# Patient Record
Sex: Female | Born: 2000 | Race: White | Hispanic: Yes | Marital: Single | Smoking: Never smoker
Health system: Southern US, Community
[De-identification: ages and names within clinical notes are randomized; demographics above are authoritative.]

## PROBLEM LIST (undated history)

## (undated) DIAGNOSIS — D6800 Von Willebrand disease, unspecified: Secondary | ICD-10-CM

## (undated) DIAGNOSIS — F419 Anxiety disorder, unspecified: Secondary | ICD-10-CM

## (undated) DIAGNOSIS — J45909 Unspecified asthma, uncomplicated: Secondary | ICD-10-CM

## (undated) DIAGNOSIS — F32A Depression, unspecified: Secondary | ICD-10-CM

## (undated) HISTORY — PX: BARIATRIC SURGERY: SHX1103

## (undated) HISTORY — DX: Anxiety disorder, unspecified: F41.9

## (undated) HISTORY — PX: CHOLECYSTECTOMY: SHX55

## (undated) HISTORY — DX: Von Willebrand disease, unspecified: D68.00

## (undated) HISTORY — DX: Depression, unspecified: F32.A

---
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
## 2022-01-12 ENCOUNTER — Ambulatory Visit (INDEPENDENT_AMBULATORY_CARE_PROVIDER_SITE_OTHER): Payer: Medicaid Other | Admitting: Certified Nurse Midwife

## 2022-01-12 ENCOUNTER — Ambulatory Visit: Payer: Medicaid Other | Admitting: *Deleted

## 2022-01-12 ENCOUNTER — Other Ambulatory Visit: Payer: Self-pay | Admitting: *Deleted

## 2022-01-12 ENCOUNTER — Ambulatory Visit: Payer: Medicaid Other

## 2022-01-12 ENCOUNTER — Ambulatory Visit: Payer: Medicaid Other | Attending: Obstetrics and Gynecology

## 2022-01-12 VITALS — BP 123/66 | HR 91 | Wt 205.4 lb

## 2022-01-12 VITALS — BP 112/48 | HR 91

## 2022-01-12 DIAGNOSIS — Z3A29 29 weeks gestation of pregnancy: Secondary | ICD-10-CM

## 2022-01-12 DIAGNOSIS — O99113 Other diseases of the blood and blood-forming organs and certain disorders involving the immune mechanism complicating pregnancy, third trimester: Secondary | ICD-10-CM

## 2022-01-12 DIAGNOSIS — O09892 Supervision of other high risk pregnancies, second trimester: Secondary | ICD-10-CM | POA: Diagnosis present

## 2022-01-12 DIAGNOSIS — O0990 Supervision of high risk pregnancy, unspecified, unspecified trimester: Secondary | ICD-10-CM | POA: Diagnosis present

## 2022-01-12 DIAGNOSIS — Z3689 Encounter for other specified antenatal screening: Secondary | ICD-10-CM

## 2022-01-12 DIAGNOSIS — D6800 Von Willebrand disease, unspecified: Secondary | ICD-10-CM | POA: Diagnosis present

## 2022-01-12 DIAGNOSIS — O09893 Supervision of other high risk pregnancies, third trimester: Secondary | ICD-10-CM

## 2022-01-12 DIAGNOSIS — O99891 Other specified diseases and conditions complicating pregnancy: Secondary | ICD-10-CM

## 2022-01-12 DIAGNOSIS — F419 Anxiety disorder, unspecified: Secondary | ICD-10-CM | POA: Insufficient documentation

## 2022-01-12 DIAGNOSIS — O99843 Bariatric surgery status complicating pregnancy, third trimester: Secondary | ICD-10-CM

## 2022-01-12 DIAGNOSIS — Z9884 Bariatric surgery status: Secondary | ICD-10-CM | POA: Diagnosis present

## 2022-01-12 DIAGNOSIS — O0993 Supervision of high risk pregnancy, unspecified, third trimester: Secondary | ICD-10-CM

## 2022-01-12 NOTE — Progress Notes (Signed)
   PRENATAL VISIT NOTE  Subjective:  Brenda Ramirez is a 21 y.o. G1P0 at [redacted]w[redacted]d being seen today for ongoing prenatal care.  She is currently monitored for the following issues for this high-risk pregnancy and has Von Willebrand disease (HCC); Asthma; Supervision of high risk pregnancy, antepartum; Anxiety; Depression; and History of sleeve gastrectomy on their problem list.  Patient reports no complaints.  Contractions: Not present. Vag. Bleeding: None.  Movement: Present. Denies leaking of fluid.   The following portions of the patient's history were reviewed and updated as appropriate: allergies, current medications, past family history, past medical history, past social history, past surgical history and problem list.   Objective:   Vitals:   01/12/22 0902  BP: 123/66  Pulse: 91  Weight: 205 lb 6.4 oz (93.2 kg)   Fetal Status: Fetal Heart Rate (bpm): 152 Fundal Height: 30 cm Movement: Present     General:  Alert, oriented and cooperative. Patient is in no acute distress.  Skin: Skin is warm and dry. No rash noted.   Cardiovascular: Normal heart rate noted  Respiratory: Normal respiratory effort, no problems with respiration noted  Abdomen: Soft, gravid, appropriate for gestational age.  Pain/Pressure: Absent     Pelvic: Cervical exam deferred        Extremities: Normal range of motion.  Edema: None  Mental Status: Normal mood and affect. Normal behavior. Normal judgment and thought content.   Assessment and Plan:  Pregnancy: G1P0 at [redacted]w[redacted]d 1. Supervision of high risk pregnancy in third trimester - Doing well, feeling regular and vigorous fetal movement   2. [redacted] weeks gestation of pregnancy - Routine OB care including review of 3rd trimester labs   Centering Pregnancy, Session#6: Reviewed resources in [employer].  Facilitated discussion today:  labor coping strategies immediate postpartum planning mindfulness activity with positive affirmations Patient to  continue group care.   Preterm labor symptoms and general obstetric precautions including but not limited to vaginal bleeding, contractions, leaking of fluid and fetal movement were reviewed in detail with the patient. Please refer to After Visit Summary for other counseling recommendations.   Return in about 2 weeks (around 01/26/2022) for IN-PERSON, CENTERING.  Future Appointments  Date Time Provider Department Center  01/26/2022  9:00 AM CENTERING PROVIDER [facility]  02/09/2022  9:00 AM CENTERING PROVIDER [facility]  02/11/2022 10:30 AM [facility] NURSE [facility]  02/11/2022 10:45 AM [facility] US6 [facility]  02/23/2022  9:00 AM CENTERING PROVIDER [facility]  03/09/2022  9:00 AM CENTERING PROVIDER [facility]    Bernerd Limbo, CNM

## 2022-01-18 ENCOUNTER — Emergency Department (HOSPITAL_BASED_OUTPATIENT_CLINIC_OR_DEPARTMENT_OTHER)
Admission: EM | Admit: 2022-01-18 | Discharge: 2022-01-19 | Disposition: A | Discharge status: Home / Self Care | Payer: Medicaid Other | Attending: Emergency Medicine | Admitting: Emergency Medicine

## 2022-01-18 ENCOUNTER — Encounter (HOSPITAL_BASED_OUTPATIENT_CLINIC_OR_DEPARTMENT_OTHER): Payer: Self-pay | Admitting: Urology

## 2022-01-18 ENCOUNTER — Encounter: Payer: Medicaid Other | Admitting: Nurse Practitioner

## 2022-01-18 DIAGNOSIS — R519 Headache, unspecified: Secondary | ICD-10-CM | POA: Diagnosis not present

## 2022-01-18 DIAGNOSIS — R197 Diarrhea, unspecified: Secondary | ICD-10-CM | POA: Insufficient documentation

## 2022-01-18 DIAGNOSIS — B349 Viral infection, unspecified: Secondary | ICD-10-CM | POA: Diagnosis present

## 2022-01-18 DIAGNOSIS — J101 Influenza due to other identified influenza virus with other respiratory manifestations: Secondary | ICD-10-CM | POA: Insufficient documentation

## 2022-01-18 DIAGNOSIS — Z20822 Contact with and (suspected) exposure to covid-19: Secondary | ICD-10-CM | POA: Insufficient documentation

## 2022-01-18 DIAGNOSIS — R3121 Asymptomatic microscopic hematuria: Secondary | ICD-10-CM

## 2022-01-18 DIAGNOSIS — R112 Nausea with vomiting, unspecified: Secondary | ICD-10-CM | POA: Insufficient documentation

## 2022-01-18 DIAGNOSIS — M791 Myalgia, unspecified site: Secondary | ICD-10-CM | POA: Diagnosis not present

## 2022-01-18 DIAGNOSIS — R5383 Other fatigue: Secondary | ICD-10-CM | POA: Diagnosis not present

## 2022-01-18 DIAGNOSIS — E876 Hypokalemia: Secondary | ICD-10-CM | POA: Insufficient documentation

## 2022-01-18 LAB — CBC WITH DIFFERENTIAL/PLATELET
Abs Immature Granulocytes: 0.05 10*3/uL (ref 0.00–0.07)
Basophils Absolute: 0 10*3/uL (ref 0.0–0.1)
Basophils Relative: 0 %
Eosinophils Absolute: 0.2 10*3/uL (ref 0.0–0.5)
Eosinophils Relative: 2 %
HCT: 28.3 % — ABNORMAL LOW (ref 36.0–46.0)
Hemoglobin: 9.3 g/dL — ABNORMAL LOW (ref 12.0–15.0)
Immature Granulocytes: 1 %
Lymphocytes Relative: 32 %
Lymphs Abs: 3.1 10*3/uL (ref 0.7–4.0)
MCH: 30.9 pg (ref 26.0–34.0)
MCHC: 32.9 g/dL (ref 30.0–36.0)
MCV: 94 fL (ref 80.0–100.0)
Monocytes Absolute: 0.7 10*3/uL (ref 0.1–1.0)
Monocytes Relative: 8 %
Neutro Abs: 5.4 10*3/uL (ref 1.7–7.7)
Neutrophils Relative %: 57 %
Platelets: 243 10*3/uL (ref 150–400)
RBC: 3.01 MIL/uL — ABNORMAL LOW (ref 3.87–5.11)
RDW: 12.6 % (ref 11.5–15.5)
WBC: 9.4 10*3/uL (ref 4.0–10.5)
nRBC: 0 % (ref 0.0–0.2)

## 2022-01-18 LAB — SARS CORONAVIRUS 2 BY RT PCR: SARS Coronavirus 2 by RT PCR: NEGATIVE

## 2022-01-18 LAB — COMPREHENSIVE METABOLIC PANEL
ALT: 10 U/L (ref 0–44)
AST: 16 U/L (ref 15–41)
Albumin: 2.8 g/dL — ABNORMAL LOW (ref 3.5–5.0)
Alkaline Phosphatase: 71 U/L (ref 38–126)
Anion gap: 5 (ref 5–15)
BUN: 6 mg/dL (ref 6–20)
CO2: 22 mmol/L (ref 22–32)
Calcium: 8.1 mg/dL — ABNORMAL LOW (ref 8.9–10.3)
Chloride: 109 mmol/L (ref 98–111)
Creatinine, Ser: 0.53 mg/dL (ref 0.44–1.00)
GFR, Estimated: >60 mL/min (ref >60)
Glucose, Bld: 84 mg/dL (ref 70–99)
Potassium: 3.4 mmol/L — ABNORMAL LOW (ref 3.5–5.1)
Sodium: 136 mmol/L (ref 135–145)
Total Bilirubin: 0.5 mg/dL (ref 0.3–1.2)
Total Protein: 6.6 g/dL (ref 6.5–8.1)

## 2022-01-18 LAB — URINALYSIS, ROUTINE W REFLEX MICROSCOPIC
Bilirubin Urine: NEGATIVE
Glucose, UA: NEGATIVE mg/dL
Ketones, ur: NEGATIVE mg/dL
Nitrite: NEGATIVE
Protein, ur: 30 mg/dL — AB
Specific Gravity, Urine: >=1.03 (ref 1.005–1.030)
pH: 6 (ref 5.0–8.0)

## 2022-01-18 LAB — URINALYSIS, MICROSCOPIC (REFLEX): RBC / HPF: >50 RBC/hpf (ref 0–5)

## 2022-01-18 LAB — LIPASE, BLOOD: Lipase: 27 U/L (ref 11–51)

## 2022-01-18 MED ORDER — ACETAMINOPHEN 325 MG PO TABS
ORAL_TABLET | ORAL | Status: AC
  Filled 2022-01-18: qty 2

## 2022-01-18 MED ORDER — ONDANSETRON 4 MG PO TBDP
4.0000 mg | ORAL_TABLET | Freq: Once | ORAL | Status: AC
  Administered 2022-01-18: 4 mg via ORAL

## 2022-01-18 MED ORDER — SODIUM CHLORIDE 0.9 % IV BOLUS
1000.0000 mL | Freq: Once | INTRAVENOUS | Status: AC
  Administered 2022-01-18: 1000 mL via INTRAVENOUS

## 2022-01-18 MED ORDER — ACETAMINOPHEN 325 MG PO TABS
650.0000 mg | ORAL_TABLET | Freq: Once | ORAL | Status: AC
Start: 2022-01-18 — End: 2022-01-18
  Administered 2022-01-18: 650 mg via ORAL

## 2022-01-18 MED ORDER — ONDANSETRON 4 MG PO TBDP
ORAL_TABLET | ORAL | Status: AC
  Filled 2022-01-18: qty 1

## 2022-01-18 NOTE — Progress Notes (Signed)
Because you are pregnant, I feel your condition warrants further evaluation and I recommend that you be seen in a face to face visit. It is best you are evaluated for dehydration and to assure you and your baby are receiving proper nutrients while you recover.    NOTE: There will be NO CHARGE for this eVisit   If you are having a true medical emergency please call 911.      For an urgent face to face visit, [place] has seven urgent care centers for your convenience:     [facility] Health Urgent Care Center at [place] Directions [phone] [address] [place], [place] [address]    [facility] Health Urgent Care Center [facility]) Get Driving Directions [phone] [address] [place], [place] [address]  [facility] Health Urgent Care Center [facility]) Get Driving Directions [phone] [address] [place],  [place]  [address]  [facility] Urgent Care Center [facility] [employer] Directions  [phone] [address] [place],  [place] [address]   [facility] Health Urgent Care at [facility] Get Driving Directions [phone] [address] [place], [place] [address]   [facility] Health Urgent Care at [facility] Get Driving Directions  [phone] [address] [place], [place] [address]   [facility] Health Urgent Care at [facility] Directions [phone] [address] [place], [place] [address]  Your MyChart E-visit questionnaire answers were reviewed by a board certified advanced clinical practitioner to complete your personal care plan based on your specific symptoms.  Thank you for using e-Visits.

## 2022-01-18 NOTE — Discharge Instructions (Signed)
You were seen in the emergency department for evaluation of nausea vomiting diarrhea body aches headache.  Your COVID test was negative.  You had lab work done that did not show an obvious explanation for your symptoms.  Please follow-up with your OB as scheduled.  Tylenol for pain.  Keep well-hydrated.

## 2022-01-18 NOTE — Progress Notes (Signed)
Dr Para March notified of pt and complaints.  MD reviews lab results and fhr tracing.  MD says pt may be OB cleared at this time.

## 2022-01-18 NOTE — Progress Notes (Signed)
[facility] notified that pt is OB cleared and is being removed from monitors at this time.

## 2022-01-18 NOTE — ED Triage Notes (Signed)
Nausea/vomiting/diarrhea, body aches, headache, sweats and chills x 3 days  States Abd pain, denies any vaginal discharge Feeling baby movement normally   Pt states [redacted] week Gestation (EDD 03/29/2022)  OB/GYN: [address], [place] [address]. Shawnie Pons G-1, P-0. A-0

## 2022-01-18 NOTE — ED Provider Notes (Signed)
[facility] Provider Note   CSN: [id number] Arrival date & time: 01/18/22  2104     History {Add pertinent medical, surgical, social history, OB history to HPI:1} Chief Complaint  Patient presents with   Flu like Symptoms    Brenda Ramirez is a 21 y.o. female.  He is a G1, P0 about [redacted] weeks pregnant here with headache body aches nausea vomiting diarrhea that started 3 days ago.  She is felt hot and cold but has not checked her temperature.  She does endorse abdominal pain but there is also just pain over her entire body.  No blood in the vomit or diarrhea.  No urinary symptoms.  No vaginal bleeding or discharge.  She has felt the baby move.  She talked to her OB who recommended she come to any [facility] emergency department for evaluation.  The history is provided by the patient.  Influenza Presenting symptoms: diarrhea, fatigue, headache, myalgias, nausea and vomiting   Presenting symptoms: no cough, no rhinorrhea, no shortness of breath and no sore throat   Myalgias:    Location:  Generalized   Quality:  Aching   Onset quality:  Gradual   Duration:  4 days   Timing:  Constant   Progression:  Unchanged      Home Medications Prior to Admission medications   Medication Sig Start Date End Date Taking? Authorizing Provider  albuterol (VENTOLIN HFA) 108 (90 Base) MCG/ACT inhaler Inhale 2 puffs into the lungs every 6 (six) hours as needed for wheezing or shortness of breath. Patient not taking: Reported on 09/22/2021 08/30/21   Donnamae Jude, MD  busPIRone (BUSPAR) 7.5 MG tablet Take 1 tablet (7.5 mg total) by mouth 3 (three) times daily. Patient not taking: Reported on 09/22/2021 08/30/21   Donnamae Jude, MD  cyclobenzaprine (FLEXERIL) 5 MG tablet Take 1 tablet (5 mg total) by mouth 3 (three) times daily as needed for muscle spasms. Patient not taking: Reported on 12/15/2021 12/13/21   Gabriel Carina, CNM  ferrous sulfate (FERROUSUL) 325 (65  FE) MG tablet Take 1 tablet (325 mg total) by mouth 2 (two) times daily. Patient not taking: Reported on 11/03/2021 08/31/21   Donnamae Jude, MD  Prenatal Vit-Fe Fumarate-FA (PRENATAL VITAMIN) 27-0.8 MG TABS Take 1 tablet by mouth daily. 08/30/21   Donnamae Jude, MD  vitamin B-12 (CYANOCOBALAMIN) 100 MCG tablet Take 1 tablet (100 mcg total) by mouth daily. Patient not taking: Reported on 09/22/2021 08/31/21   Donnamae Jude, MD  Vitamin D, Ergocalciferol, (DRISDOL) 1.25 MG (50000 UNIT) CAPS capsule Take 1 capsule (50,000 Units total) by mouth every 7 (seven) days. Patient not taking: Reported on 12/15/2021 11/15/21   Renee Harder, CNM      Allergies    Pineapple    Review of Systems   Review of Systems  Constitutional:  Positive for fatigue.  HENT:  Negative for rhinorrhea and sore throat.   Respiratory:  Negative for cough and shortness of breath.   Cardiovascular:  Negative for chest pain and leg swelling.  Gastrointestinal:  Positive for abdominal pain, diarrhea, nausea and vomiting.  Genitourinary:  Negative for dysuria, vaginal bleeding and vaginal discharge.  Musculoskeletal:  Positive for myalgias.  Skin:  Negative for rash.  Neurological:  Positive for headaches.    Physical Exam Updated Vital Signs BP 120/66 (BP Location: Left Arm)   Pulse 83   Temp 98.3 F (36.8 C) (Oral)   Resp 20  Ht 5\' 6"  (1.676 m)   Wt 92.8 kg   LMP 06/14/2021   SpO2 100%   BMI 33.01 kg/m  Physical Exam Vitals and nursing note reviewed.  Constitutional:      General: She is not in acute distress.    Appearance: Normal appearance. She is well-developed.  HENT:     Head: Normocephalic and atraumatic.  Eyes:     Conjunctiva/sclera: Conjunctivae normal.  Cardiovascular:     Rate and Rhythm: Normal rate and regular rhythm.     Heart sounds: No murmur heard. Pulmonary:     Effort: Pulmonary effort is normal. No respiratory distress.     Breath sounds: Normal breath sounds.  Abdominal:      Palpations: Abdomen is soft.     Tenderness: There is no abdominal tenderness. There is no guarding or rebound.  Musculoskeletal:        General: Normal range of motion.     Cervical back: Neck supple.     Right lower leg: No edema.     Left lower leg: No edema.  Skin:    General: Skin is warm and dry.     Capillary Refill: Capillary refill takes less than 2 seconds.  Neurological:     General: No focal deficit present.     Mental Status: She is alert.     Sensory: No sensory deficit.     Motor: No weakness.     ED Results / Procedures / Treatments   Labs (all labs ordered are listed, but only abnormal results are displayed) Labs Reviewed  SARS CORONAVIRUS 2 BY RT PCR  COMPREHENSIVE METABOLIC PANEL  LIPASE, BLOOD  CBC WITH DIFFERENTIAL/PLATELET  URINALYSIS, ROUTINE W REFLEX MICROSCOPIC    EKG None  Radiology No results found.  Procedures Procedures  {Document cardiac monitor, telemetry assessment procedure when appropriate:1}  Medications Ordered in ED Medications  sodium chloride 0.9 % bolus 1,000 mL (has no administration in time range)    ED Course/ Medical Decision Making/ A&P                           Medical Decision Making Amount and/or Complexity of Data Reviewed Labs: ordered.  [name] was evaluated in Emergency Department on 01/18/2022 for the symptoms described in the history of present illness. She was evaluated in the context of the global COVID-19 pandemic, which necessitated consideration that the patient might be at risk for infection with the SARS-CoV-2 virus that causes COVID-19. Institutional protocols and algorithms that pertain to the evaluation of patients at risk for COVID-19 are in a state of rapid change based on information released by regulatory bodies including the CDC and federal and state organizations. These policies and algorithms were followed during the patient's care in the ED.  This patient complains of  ***; this involves an extensive number of treatment Options and is a complaint that carries with it a high risk of complications and morbidity. The differential includes ***  I ordered, reviewed and interpreted labs, which included *** I ordered medication *** and reviewed PMP when indicated. I ordered imaging studies which included *** and I independently    visualized and interpreted imaging which showed *** Additional history obtained from *** Previous records obtained and reviewed *** I consulted *** and discussed lab and imaging findings and discussed disposition.  Cardiac monitoring reviewed, *** Social determinants considered, *** Critical Interventions: ***  After the interventions stated above, I reevaluated the  patient and found *** Admission and further testing considered, ***    {Document critical care time when appropriate:1} {Document review of labs and clinical decision tools ie heart score, Chads2Vasc2 etc:1}  {Document your independent review of radiology images, and any outside records:1} {Document your discussion with family members, caretakers, and with consultants:1} {Document social determinants of health affecting pt's care:1} {Document your decision making why or why not admission, treatments were needed:1} Final Clinical Impression(s) / ED Diagnoses Final diagnoses:  None    Rx / DC Orders ED Discharge Orders     None

## 2022-01-18 NOTE — Progress Notes (Signed)
RROB called to remotely monitor pt who is G1P0 at [redacted]wk pregnant who receives prenatal care with [facility].  She presents to [facility] complaining of flu like symptoms and abdominal pain that is constant.  She also reports N/V/D and says this has been going on for 3days.  Pt denies LOF or vag bleeding and reports normal fetal movement.  Pt was placed on fhr monitors by [facility] but the wrong MRN was assigned to the OBIX bed. That portion of the fhr tracing is under MRN [id number].  The RROB nurse fixed that issue and now obix is tracing on the correct patient.

## 2022-02-09 NOTE — Progress Notes (Unsigned)
Called Pt to find out why she has missed her last 2 Centering Session, she states it was due to transportation, she has been reaching out to [facility] but no one is picking up. Reminded Pt of [place] that's scheduled for 02/11/22 & that she only has 2 more Centering Sessions. Reiterated that Centering is her Prenatal care also. Pt verbalized understanding. Per Edd Arbour, CNM, Pt needs to be seen, will put her on OB schedule.

## 2022-02-10 ENCOUNTER — Encounter: Payer: Self-pay | Admitting: Certified Nurse Midwife

## 2022-02-11 ENCOUNTER — Ambulatory Visit: Payer: Medicaid Other

## 2022-02-11 ENCOUNTER — Ambulatory Visit: Payer: Medicaid Other | Attending: Maternal & Fetal Medicine

## 2022-02-14 ENCOUNTER — Encounter: Payer: Medicaid Other | Admitting: Advanced Practice Midwife

## 2022-02-16 NOTE — Progress Notes (Unsigned)
   PRENATAL VISIT NOTE  Subjective:  Brenda Ramirez is a 21 y.o. G1P0 at [redacted]w[redacted]d being seen today for ongoing prenatal care.  She is currently monitored for the following issues for this high-risk pregnancy and has Von Willebrand disease (HCC); Asthma; Supervision of high risk pregnancy, antepartum; Anxiety; Depression; and History of sleeve gastrectomy on their problem list.  Patient reports {sx:14538}.  Contractions: Not present. Vag. Bleeding: None.  Movement: Present. Denies leaking of fluid.   The following portions of the patient's history were reviewed and updated as appropriate: allergies, current medications, past family history, past medical history, past social history, past surgical history and problem list.   Objective:   Vitals:   02/17/22 0915  BP: 112/66  Pulse: 83  Weight: 210 lb 14.4 oz (95.7 kg)    Fetal Status: Fetal Heart Rate (bpm): 145   Movement: Present     General:  Alert, oriented and cooperative. Patient is in no acute distress.  Skin: Skin is warm and dry. No rash noted.   Cardiovascular: Normal heart rate noted  Respiratory: Normal respiratory effort, no problems with respiration noted  Abdomen: Soft, gravid, appropriate for gestational age.  Pain/Pressure: Absent     Pelvic: {Blank single:19197::"Cervical exam performed in the presence of a chaperone","Cervical exam deferred"}        Extremities: Normal range of motion.  Edema: None  Mental Status: Normal mood and affect. Normal behavior. Normal judgment and thought content.   Assessment and Plan:  Pregnancy: G1P0 at [redacted]w[redacted]d 1. History of sleeve gastrectomy - Growth US's and antenatal testing per MFM  2. Other depression - ***IBH?  3. Von Willebrand disease (HCC) ***  4. Supervision of high risk pregnancy, antepartum - F/U Centering 9/20. Emphasized importance of attending [facility] and growth US's.   Preterm labor symptoms and general obstetric precautions including but not limited to  vaginal bleeding, contractions, leaking of fluid and fetal movement were reviewed in detail with the patient. Please refer to After Visit Summary for other counseling recommendations.   No follow-ups on file.  Future Appointments  Date Time Provider Department Center  02/17/2022 10:30 AM [facility] NURSE [facility]  02/17/2022 10:45 AM [facility] US4 [facility]  02/23/2022  9:00 AM CENTERING PROVIDER [facility]  03/09/2022  9:00 AM CENTERING PROVIDER [facility]    Dorathy Kinsman, CNM

## 2022-02-16 NOTE — Patient Instructions (Addendum)
AREA PEDIATRIC/FAMILY PRACTICE PHYSICIANS  Central/[place]outheast [place] ([address]) [facility] Chambliss, MD; Eniola, MD; Hale, MD; Hensel, MD; McDiarmid, MD; McIntyer, MD; Neal, MD; Walden, MD [address] [place], [place] [address] [phone] Mon-Fri 8:30-12:30, 1:30-5:00 Providers come to see babies at [facility] Accepting Medicaid [facility] providers who accept newborns: Koirala, MD; Morrow, MD; Wolters, MD [address], [place], [place] [address] [phone] Mon-Fri 8:00-5:30 Babies seen by providers at [facility] Does NOT accept Medicaid Please call early in hospitalization for appointment (limited availability)  [facility] Mulberry, MD [address] [place], [place] [address] [phone] Mon, Tue, Thur, Fri 8:30-5:00, Wed 10:00-7:00 (closed 1-2pm) Babies seen by [facility] providers Accepting Medicaid Rubin - Pediatrician Rubin, MD [address], [place], [place] [address] [phone] Mon-Fri 8:30-5:00, Sat 8:30-12:00 Provider comes to see babies at [facility] Accepting Medicaid Must have been referred from current patients or contacted office prior to delivery [facility] ([facility]) Brown, MD; Chandler, MD; Ettefagh, MD; Grant, MD; Lester, MD; McCormick, MD; McQueen, MD; Prose, MD; Simha, MD; Stanley, MD; Stryffeler, NP; Tebben, NP [address], [place], [place] [address] [phone] Mon, Tue, Thur, Fri 8:30-5:30, Wed 9:30-5:30, Sat 8:30-12:30 Babies seen by [facility] providers Accepting Medicaid Only accepting infants of first-time parents or siblings of current patients Hospital discharge coordinator will make follow-up appointment Jack Amos [address], [place], [place]  [address] [phone]   Fax - [phone] [facility] [address], [place], [place]  [address] Phone - [phone]   Fax - [phone] Shilpa Gosrani [address], [place], [place]  [address] [phone]  [place]heast [place] ([address]) [facility] Bates, MD; Brassfield, MD; Cooper, Cox, MD; MD; Davis, MD; Dovico, MD; Ettefaugh, MD; Little, MD; Lowe, MD; Keiffer, MD; Melvin, MD; Sumner, MD; Williams, MD [address], [place], [place] [address] [phone] Mon-Fri 8:30-5:00 (extended evenings Mon-Thur as needed), Sat-Sun 10:00-1:00 Providers come to see babies at [facility] Accepting Medicaid for families of first-time babies and families with all children in the household age 3 and under. Must register with office prior to making appointment (M-F only). [facility] Henson, NP; Knapp, MD; Lalonde, MD; Tysinger, PA [address] [place], [place] [address] [phone] Mon-Fri 8:00-5:00 Babies seen by providers at [facility] Does NOT accept Medicaid/Commercial Insurance Only [facility] ([facility])  Artis, MD; Barnes, MD; Bratton, MD; Coccaro, MD; [place] Gardner, MD; Kramer, MD; Marshall, MD; Netherton, MD; Poleto, MD; Skinner, MD [address] [place], [place] [address] [phone] Mon-Fri 8:30-5:30, Sat (Oct.-Mar.) 9:00-1:00 Babies seen by providers at [facility] Accepting Medicaid  [place] ([address]) [facility] Reid, MD; Warner, MD [address], [place],  [address] [phone] Mon-Fri 8:30-5:00, Sat 8:30-12:00 Providers come to see babies at [facility] Does NOT accept Medicaid [facility] Becker, PA; Hagler, MD; Scifres, PA; Sun, MD; Swayne, MD [address], ,  [address] [phone] Mon-Fri 8:00-5:00 Babies seen by providers at [facility] Does NOT accept Medicaid Only accepting babies of parents who  are patients Please call early in hospitalization for appointment (limited availability)  Pediatricians Clark, MD; Frye, MD; Kelleher, MD; Mack, NP; Miller, MD; O'Keller, MD; Patterson, NP; Pudlo, MD; Puzio, MD; Thomas, MD; Tucker, MD; Twiselton, MD [address], [place], [place] [address] [phone] Mon-Fri 8:00-5:00, Sat 9:00-12:00 Providers come to see babies at [facility] Does NOT accept Medicaid  [place] ([address]) [facility] providers accepting new patients: Brake, NP; Wharton, PA [address], [place], [place] [address] [phone] Mon-Fri 8:00-5:00 Babies seen by providers at [facility] Does NOT accept Medicaid Only accepting babies of parents who are patients Please call early in hospitalization for appointment (limited availability) [facility] Gay, MD; Quinlan, MD [address] [place], [place] [address] [phone] (press 1 to schedule appointment) Mon-Fri 8:00-5:00 Providers come to see babies at [facility] Does NOT accept Medicaid [facility] Mazer, MD [address] [place], [place] [address] [phone] Mon-Fri 8:30-5:00 (lunch 12:30-1:00), extended hours by appointment only Wed 5:00-6:30 Babies seen by [facility] providers Accepting Medicaid [facility] Banks, MD; Jordan, MD; Koberlein, MD [address], [place], [place] [address] [phone] Mon-Fri 8:00-5:00 Babies seen by [facility] providers Does NOT accept Medicaid [facility] Parker, MD; Hunter, MD; Wallace, DO [address] [place], [place] [address] [phone] Mon-Fri 8:00-5:00 Babies seen by [facility] providers Does NOT accept Medicaid [facility] Brandon, PA; Brecken, PA; Christy, NP; Dees, MD; DeClaire, MD; DeWeese, MD; Hansen, NP; Mills, NP; Parrish, NP; Smoot, NP; Summer, MD; Vapne,  MD [address] [place], [place] [address] [phone] Mon-Fri 8:30-5:00, Sat 10:00-1:00 Providers come to see babies at [facility] Does NOT accept Medicaid Free prenatal information session Tuesdays at 4:45pm [facility] Bouska, MD; Gordon, PA; Jeffery, PA; Weber, PA [address] [place] [address] [phone] Mon-Fri 7:30-5:30 Babies seen by [facility] providers [facility] [address], [place], [place]  [address] [phone]   Fax - [phone]  [place] ([address] & [address]) [facility] [place] [address] [place], [place] [address] [phone] Mon-Thur 8:00-6:00 Providers come to see babies at [facility] Accepting Medicaid [facility] Anderson, NP; Badger, MD; Beal, PA; Spencer, PA [address] [place], [place] [address] [phone] Mon-Thur 7:30-7:30, Fri 7:30-4:30 Babies seen by [facility] providers Accepting Medicaid [facility] Agbuya, MD; Klett, NP; Romgoolam, MD [address], [place], [place] [address] [phone] Mon-Fri 8:30-5:00, Sat 8:30-12:00 Providers come to see babies at [facility] Accepting Medicaid Must have "Meet & Greet" appointment at office prior to delivery [facility] McCord, MD; Wallace, MD; Wood, MD [address], [place], [place] [address] [phone] Mon-Wed 8:00-6:00, Thur-Fri 8:00-5:00, Sat 9:00-12:00 Providers come to see babies at [facility] Does NOT accept Medicaid Only accepting siblings of current patients [facility]  [address], [place], [place]  [address] [phone]   Fax - [phone] [facility] [address], [place], [place]  [address] [phone]   Fax -  [phone]  [place] ([address] & [address]) [facility] Cirigliano, DO; Matthews, DO [address] [place], [place] [address] [phone] Mon-Fri 7:00-5:00 Babies seen by [facility] providers Does NOT accept Medicaid [facility] Briscoe, MD; Howley, PA; Moreira, PA [address], [place], [place] [address] [phone] Mon-Fri 8:00-5:00 Babies seen by [facility] providers Accepting Medicaid [facility] Boyd, MD; Church, PA; Jones, NP; Osborn, PA [address], ,  [address] [phone] Mon-Fri 8:00-5:00 Babies seen by providers at [facility] Accepting Medicaid  [place] ([address]) [facility] [place] Wendling, DO [address] [place], [place] [address] [phone] Mon-Fri 8:00-5:00 Babies seen by [facility] providers Does NOT accept Medicaid Limited availability, please call early in hospitalization to schedule follow-up [facility] Calderon, PA; Cummings, MD; Dillard, MD; Martin, PA; Olson, MD; VanDeven, PA [address], [place], [place] [address] [phone] Mon-Fri 8:30-5:00, Sat 9:00-12:00 Babies seen by providers at [facility] Accepting Medicaid Please register online then schedule online or call office [email or web address] Hunter, NP; Kumar, MD; Martin Rogers, PA [address], [place], [place] [address] [phone] Mon-Fri 8:00-5:00 Babies seen by providers at [facility] Accepting Medicaid [facility] ([facility]) Boswell, MD; Kristi Fleenor, NP; West, MD [address], [place], [place] [address] [phone] Mon-Fri 8:00-5:30, Sat&Sun by appointment (phones open at  8:30) Babies seen by [facility] providers Accepting Medicaid Must be a first-time baby or sibling of current patient [facility] [place]  [address], [place], [place]  [address] [phone]   Fax - [phone]  [place] ([address]) [facility] Brown, PA; Cowen, PA; Rice, MD; Helton, PA; Spry, MD [address] [place], [place] [address] [phone] Mon-Thur 8:00-7:00, Fri 8:00-5:00, Sat 8:00-12:00, Sun 9:00-12:00 Babies seen by [facility] providers Accepting Medicaid [facility] Coe-Goins, MD; Marshall, MD; Pierre-Louis, MD [address], [place], [place] [address] [phone] Mon-Thur 8:00-5:00 Babies seen by providers at [facility] Accepting Medicaid [facility] Bratton, MD; Coe-Goins, MD; Hayes, MD; Lewis, MD; List, MD; Lott, MD; Marshall, MD; Moran, MD; O'Neal, MD; Pierre-Louis, MD; Pitonzo, MD; Scholer, MD; Spangle, MD [address] [place], [place] [address] [phone] Mon-Fri 8:00-5:30, Sat (Oct.-Mar.) 9:00-1:00 Babies seen by providers at [facility] Accepting Medicaid Must fill out new patient packet, available online at [email or web address] [facility] ([facility]) Friddle, NP; Harris, NP; Kelly, NP; Logan, MD; Melvin, PA; Poth, MD; Ramadoss, MD; Stanton, NP [address], [place], [place] [address] [phone] Mon-Thur 8:00-5:30, Fri 8:00-5:00 Babies seen by providers at [facility] Accepting Medicaid  [place] ([address]) [facility] Dixon, PA; [place], MD; Pickard, MD; Tapia, PA [address], [place], [place] [address] [phone] Mon-Fri 8:00-5:00 Babies seen by providers at [facility] Accepting Medicaid   [place] ([address]) [facility] [place] Masneri, DO; Meyers, MD; Nelson, PA [address], [place], [place] [address] [phone] Mon-Fri 8:00-5:00 Babies seen by providers at [facility] Does NOT accept Medicaid Limited appointment availability, please call early in hospitalization  [facility] [place] Kunedd, DO; McGowen, MD [address], [place], [place] [address] [phone] Mon-Fri 8:00-5:00 Babies seen by [facility] providers Does NOT accept Medicaid [facility] [place] Cameron, MD; MacDonald, MD; Michaels, PA; Nayak, MD [address] [place] [address], [place], [place] [address] [phone] Mon-Fri 8:00-5:00 After hours clinic ([address] [place],  [address]) [phone] Mon-Fri 5:00-8:00, Sat 12:00-6:00, Sun 10:00-4:00 Babies seen by [facility] providers Accepting Medicaid [facility] [place] [address], [place], [place]  [address] [phone]   Fax - [phone]  [place] ([address]) [facility] [place] [facility] Andy, MD [address], [place], [place] [address] [phone] Mon-Fri 8:00-5:00 Babies seen by [facility] providers Does NOT accept Medicaid [facility] [place] ([facility] [place]) Eksir, MD [address], [place], [place] [address] [phone] Mon-Thur 8:00-7:00, Fri 8:00-5:00, Sat 8:00-12:00 Babies seen by providers at [facility] Accepting Medicaid - but does not have vaccinations in office (must be received elsewhere) Limited availability, please call early in hospitalization  [place] ([address]) [facility]  Charlene Flemming, MD [address], [place] [address] [phone]  Fax [phone]  [facility]  Kimberly Newton, MD, Annamarie Streilein, PA, Carla Hampton, PA [address] [place], [place]  [address] [phone] [facility]  [address], [place], [place] [address] [phone] [address], [place], [place] [address] [phone] (West Office)  [facility] [address], [place], [place] [address] [phone] [facility] [address] [place], [place], [place] [address] [phone] [facility] [address], [place], [place] [address] [phone] [facility] [address], [place], [place] [address] [phone] [facility] [address], [place], [place] [address] [phone] [facility] [address], [place], [place] [address] [phone] [facility]  [address], [place], [place] [address] [phone] Dr. [facility] Little [address] [place] [address], , [place] [address] [phone] [facility] [address], [place], [place] [address] [phone] [facility] [address], ,  [address] [phone]  

## 2022-02-17 ENCOUNTER — Encounter: Payer: Self-pay | Admitting: *Deleted

## 2022-02-17 ENCOUNTER — Other Ambulatory Visit: Payer: Self-pay

## 2022-02-17 ENCOUNTER — Other Ambulatory Visit: Payer: Self-pay | Admitting: *Deleted

## 2022-02-17 ENCOUNTER — Ambulatory Visit: Payer: Medicaid Other | Attending: Maternal & Fetal Medicine | Admitting: *Deleted

## 2022-02-17 ENCOUNTER — Ambulatory Visit (HOSPITAL_BASED_OUTPATIENT_CLINIC_OR_DEPARTMENT_OTHER): Payer: Medicaid Other

## 2022-02-17 ENCOUNTER — Other Ambulatory Visit: Payer: Self-pay | Admitting: Advanced Practice Midwife

## 2022-02-17 ENCOUNTER — Ambulatory Visit (INDEPENDENT_AMBULATORY_CARE_PROVIDER_SITE_OTHER): Payer: Medicaid Other | Admitting: Advanced Practice Midwife

## 2022-02-17 VITALS — BP 112/66 | HR 83 | Wt 210.9 lb

## 2022-02-17 VITALS — BP 109/50 | HR 75

## 2022-02-17 DIAGNOSIS — Z362 Encounter for other antenatal screening follow-up: Secondary | ICD-10-CM | POA: Insufficient documentation

## 2022-02-17 DIAGNOSIS — O99843 Bariatric surgery status complicating pregnancy, third trimester: Secondary | ICD-10-CM

## 2022-02-17 DIAGNOSIS — Z3689 Encounter for other specified antenatal screening: Secondary | ICD-10-CM

## 2022-02-17 DIAGNOSIS — O0990 Supervision of high risk pregnancy, unspecified, unspecified trimester: Secondary | ICD-10-CM

## 2022-02-17 DIAGNOSIS — F3289 Other specified depressive episodes: Secondary | ICD-10-CM

## 2022-02-17 DIAGNOSIS — Z23 Encounter for immunization: Secondary | ICD-10-CM

## 2022-02-17 DIAGNOSIS — Z903 Acquired absence of stomach [part of]: Secondary | ICD-10-CM

## 2022-02-17 DIAGNOSIS — D6800 Von Willebrand disease, unspecified: Secondary | ICD-10-CM | POA: Diagnosis not present

## 2022-02-17 DIAGNOSIS — O99113 Other diseases of the blood and blood-forming organs and certain disorders involving the immune mechanism complicating pregnancy, third trimester: Secondary | ICD-10-CM | POA: Insufficient documentation

## 2022-02-17 DIAGNOSIS — F419 Anxiety disorder, unspecified: Secondary | ICD-10-CM

## 2022-02-17 DIAGNOSIS — O09893 Supervision of other high risk pregnancies, third trimester: Secondary | ICD-10-CM | POA: Diagnosis not present

## 2022-02-17 DIAGNOSIS — Z3A34 34 weeks gestation of pregnancy: Secondary | ICD-10-CM | POA: Diagnosis not present

## 2022-02-17 NOTE — Addendum Note (Signed)
Addended by: Gerome Apley on: 02/17/2022 05:40 PM   Modules accepted: Orders

## 2022-02-17 NOTE — Addendum Note (Signed)
Addended by: Gerome Apley on: 02/17/2022 05:41 PM   Modules accepted: Orders

## 2022-02-18 ENCOUNTER — Other Ambulatory Visit: Payer: Self-pay | Admitting: Advanced Practice Midwife

## 2022-02-18 ENCOUNTER — Encounter: Payer: Self-pay | Admitting: Advanced Practice Midwife

## 2022-02-18 DIAGNOSIS — D6800 Von Willebrand disease, unspecified: Secondary | ICD-10-CM

## 2022-02-18 DIAGNOSIS — Z3A34 34 weeks gestation of pregnancy: Secondary | ICD-10-CM

## 2022-02-21 NOTE — Progress Notes (Signed)
Per Manya Silvas, pt needs referral appt to Hematology. I called [facility] and spoke w/the after hours service. That representative said she would give message of the referral to the Hematology staff. I asked that the Hematology staff reach out to pt directly in order to schedule appt.

## 2022-02-22 ENCOUNTER — Other Ambulatory Visit: Payer: Medicaid Other

## 2022-02-23 ENCOUNTER — Other Ambulatory Visit: Payer: Medicaid Other

## 2022-02-23 ENCOUNTER — Ambulatory Visit (INDEPENDENT_AMBULATORY_CARE_PROVIDER_SITE_OTHER): Payer: Medicaid Other | Admitting: Certified Nurse Midwife

## 2022-02-23 VITALS — BP 128/52 | HR 72 | Wt 219.2 lb

## 2022-02-23 DIAGNOSIS — D6800 Von Willebrand disease, unspecified: Secondary | ICD-10-CM

## 2022-02-23 DIAGNOSIS — O0993 Supervision of high risk pregnancy, unspecified, third trimester: Secondary | ICD-10-CM

## 2022-02-23 DIAGNOSIS — Z3A35 35 weeks gestation of pregnancy: Secondary | ICD-10-CM

## 2022-02-23 DIAGNOSIS — O0990 Supervision of high risk pregnancy, unspecified, unspecified trimester: Secondary | ICD-10-CM

## 2022-02-23 DIAGNOSIS — Z903 Acquired absence of stomach [part of]: Secondary | ICD-10-CM

## 2022-02-23 NOTE — Progress Notes (Unsigned)
   PRENATAL VISIT NOTE  Subjective:  Brenda Ramirez is a 21 y.o. G1P0 at [redacted]w[redacted]d being seen today for ongoing prenatal care.  She is currently monitored for the following issues for this high-risk pregnancy and has Von Willebrand disease (HCC); Asthma; Supervision of high risk pregnancy, antepartum; Anxiety; Depression; and History of sleeve gastrectomy on their problem list.  Patient reports no complaints.  Contractions: Not present. Vag. Bleeding: None.  Movement: Present. Denies leaking of fluid.   The following portions of the patient's history were reviewed and updated as appropriate: allergies, current medications, past family history, past medical history, past social history, past surgical history and problem list.   Objective:   Vitals:   02/23/22 0851  BP: (!) 128/52  Pulse: 72  Weight: 219 lb 3.2 oz (99.4 kg)    Fetal Status: Fetal Heart Rate (bpm): 140 Fundal Height: 35 cm Movement: Present     General:  Alert, oriented and cooperative. Patient is in no acute distress.  Skin: Skin is warm and dry. No rash noted.   Cardiovascular: Normal heart rate noted  Respiratory: Normal respiratory effort, no problems with respiration noted  Abdomen: Soft, gravid, appropriate for gestational age.  Pain/Pressure: Absent     Pelvic: Cervical exam deferred        Extremities: Normal range of motion.  Edema: None  Mental Status: Normal mood and affect. Normal behavior. Normal judgment and thought content.   Assessment and Plan:  Pregnancy: G1P0 at [redacted]w[redacted]d 1. Supervision of high risk pregnancy in third trimester - Doing well, feeling regular and vigorous fetal movement   2. [redacted] weeks gestation of pregnancy - Routine OB care including anticipatory guidance re GBS test at next visit - 1hr GTT with jelly beans completed today  3. Von Willebrand disease (HCC) - Labs drawn today per MFM request   Centering Pregnancy, Session#9: Reviewed resources in [employer].   Facilitated discussion today: what ifs of labor, Cesarean delivery if needed, pain relief options, and brainstorming about postpartum topics to review next time Patient to continue group care.   Preterm labor symptoms and general obstetric precautions including but not limited to vaginal bleeding, contractions, leaking of fluid and fetal movement were reviewed in detail with the patient. Please refer to After Visit Summary for other counseling recommendations.   Return in about 2 weeks (around 03/09/2022) for IN-PERSON, CENTERING.  Future Appointments  Date Time Provider Department Center  03/03/2022  9:55 AM Hermina Staggers, MD [facility]  03/09/2022  9:00 AM CENTERING PROVIDER [facility]  03/17/2022  3:30 PM [facility] NURSE [facility]  03/17/2022  3:45 PM [facility] US4 [facility]    Bernerd Limbo, CNM

## 2022-02-23 NOTE — Patient Instructions (Addendum)
AREA PEDIATRIC/FAMILY PRACTICE PHYSICIANS  Central/[place]outheast [place] ([address]) [facility] Chambliss, MD; Eniola, MD; Hale, MD; Hensel, MD; McDiarmid, MD; McIntyer, MD; Neal, MD; Walden, MD [address] [place], [place] [address] [phone] Mon-Fri 8:30-12:30, 1:30-5:00 Providers come to see babies at [facility] Accepting Medicaid [facility] providers who accept newborns: Koirala, MD; Morrow, MD; Wolters, MD [address], [place], [place] [address] [phone] Mon-Fri 8:00-5:30 Babies seen by providers at [facility] Does NOT accept Medicaid Please call early in hospitalization for appointment (limited availability)  [facility] Mulberry, MD [address] [place], [place] [address] [phone] Mon, Tue, Thur, Fri 8:30-5:00, Wed 10:00-7:00 (closed 1-2pm) Babies seen by [facility] providers Accepting Medicaid Rubin - Pediatrician Rubin, MD [address], [place], [place] [address] [phone] Mon-Fri 8:30-5:00, Sat 8:30-12:00 Provider comes to see babies at [facility] Accepting Medicaid Must have been referred from current patients or contacted office prior to delivery [facility] ([facility]) Brown, MD; Chandler, MD; Ettefagh, MD; Grant, MD; Lester, MD; McCormick, MD; McQueen, MD; Prose, MD; Simha, MD; Stanley, MD; Stryffeler, NP; Tebben, NP [address], [place], [place] [address] [phone] Mon, Tue, Thur, Fri 8:30-5:30, Wed 9:30-5:30, Sat 8:30-12:30 Babies seen by [facility] providers Accepting Medicaid Only accepting infants of first-time parents or siblings of current patients Hospital discharge coordinator will make follow-up appointment Jack Amos [address], [place], [place]  [address] [phone]   Fax - [phone] [facility] [address], [place], [place]  [address] Phone - [phone]   Fax - [phone] Shilpa Gosrani [address], [place], [place]  [address] [phone]  [place] ([address]) [facility] Bates, MD; Brassfield, MD; Cooper, Cox, MD; MD; Davis, MD; Dovico, MD; Ettefaugh, MD; Little, MD; Lowe, MD; Keiffer, MD; Melvin, MD; Sumner, MD; Williams, MD [address], [place], [place] [address] [phone] Mon-Fri 8:30-5:00 (extended evenings Mon-Thur as needed), Sat-Sun 10:00-1:00 Providers come to see babies at [facility] Accepting Medicaid for families of first-time babies and families with all children in the household age 3 and under. Must register with office prior to making appointment (M-F only). [facility] Henson, NP; Knapp, MD; Lalonde, MD; Tysinger, PA [address] [place], [place] [address] [phone] Mon-Fri 8:00-5:00 Babies seen by providers at [facility] Does NOT accept Medicaid/Commercial Insurance Only [facility] ([facility])  Artis, MD; Barnes, MD; Bratton, MD; Coccaro, MD; Lockett Gardner, MD; Kramer, MD; Marshall, MD; Netherton, MD; Poleto, MD; Skinner, MD [address] [place], [place] [address] [phone] Mon-Fri 8:30-5:30, Sat (Oct.-Mar.) 9:00-1:00 Babies seen by providers at [facility] Accepting Medicaid  [place] ([address]) [facility] Reid, MD; Warner, MD [address], [place], [place] [address] [phone] Mon-Fri 8:30-5:00, Sat 8:30-12:00 Providers come to see babies at [facility] Does NOT accept Medicaid [facility] Becker, PA; Hagler, MD; Scifres, PA; Sun, MD; Swayne, MD [address], [place], [place] [address] [phone] Mon-Fri 8:00-5:00 Babies seen by providers at [facility] Does NOT accept Medicaid Only accepting babies of parents who  are patients Please call early in hospitalization for appointment (limited availability)  Pediatricians Clark, MD; Frye, MD; Kelleher, MD; Mack, NP; Miller, MD; O'Keller, MD; Patterson, NP; Pudlo, MD; Puzio, MD; Thomas, MD; Tucker, MD; Twiselton, MD [address], [place], [place] [address] [phone] Mon-Fri 8:00-5:00, Sat 9:00-12:00 Providers come to see babies at [facility] Does NOT accept Medicaid  [place] ([address]) [facility] providers accepting new patients: Brake, NP; Wharton, PA [address], [place], [place] [address] [phone] Mon-Fri 8:00-5:00 Babies seen by providers at [facility] Does NOT accept Medicaid Only accepting babies of parents who are patients Please call early in hospitalization for appointment (limited availability) [facility] Gay, MD; Quinlan, MD [address] [place], [place] [address] [phone] (press 1 to schedule appointment) Mon-Fri 8:00-5:00 Providers come to see babies at [facility] Does NOT accept Medicaid [facility] Mazer, MD [address] [place], [place] [address] [phone] Mon-Fri 8:30-5:00 (lunch 12:30-1:00), extended hours by appointment only Wed 5:00-6:30 Babies seen by [facility] providers Accepting Medicaid [facility] Banks, MD; Jordan, MD; Koberlein, MD [address], [place], [place] [address] [phone] Mon-Fri 8:00-5:00 Babies seen by [facility] providers Does NOT accept Medicaid [facility] Parker, MD; Hunter, MD; Wallace, DO [address] [place], [place] [address] [phone] Mon-Fri 8:00-5:00 Babies seen by [facility] providers Does NOT accept Medicaid [facility] Brandon, PA; Brecken, PA; Christy, NP; Dees, MD; DeClaire, MD; DeWeese, MD; Hansen, NP; Mills, NP; Parrish, NP; Smoot, NP; Summer, MD; Vapne,  MD [address] [place], [place] [address] [phone] Mon-Fri 8:30-5:00, Sat 10:00-1:00 Providers come to see babies at [facility] Does NOT accept Medicaid Free prenatal information session Tuesdays at 4:45pm [facility] Bouska, MD; Gordon, PA; Jeffery, PA; Weber, PA [address] [place] [address] [phone] Mon-Fri 7:30-5:30 Babies seen by [facility] providers [facility] [address], [place], [place]  [address] [phone]   Fax - [phone]  [place] ([address] & [address]) [facility] [place] [address] [place], [place] [address] [phone] Mon-Thur 8:00-6:00 Providers come to see babies at [facility] Accepting Medicaid [facility] Anderson, NP; Badger, MD; Beal, PA; Spencer, PA [address] [place], [place] [address] [phone] Mon-Thur 7:30-7:30, Fri 7:30-4:30 Babies seen by [facility] providers Accepting Medicaid [facility] Agbuya, MD; Klett, NP; Romgoolam, MD [address], [place], [place] [address] [phone] Mon-Fri 8:30-5:00, Sat 8:30-12:00 Providers come to see babies at [facility] Accepting Medicaid Must have "Meet & Greet" appointment at office prior to delivery [facility] ([facility]) McCord, MD; Wallace, MD; Wood, MD [address], [place], [place] [address] [phone] Mon-Wed 8:00-6:00, Thur-Fri 8:00-5:00, Sat 9:00-12:00 Providers come to see babies at [facility] Does NOT accept Medicaid Only accepting siblings of current patients [facility]  [address], [place], [place]  [address] [phone]   Fax - [phone] [facility] [address], [place], [place]  [address] [phone]   Fax -  [phone]  [place] ([address] & [address]) [facility] Cirigliano, DO; Matthews, DO [address] [place], [place] [address] [phone] Mon-Fri 7:00-5:00 Babies seen by [facility] providers Does NOT accept Medicaid [facility] Briscoe, MD; Howley, PA; Moreira, PA [address], [place], [place] [address] [phone] Mon-Fri 8:00-5:00 Babies seen by [facility] providers Accepting Medicaid [facility] Boyd, MD; Church, PA; Jones, NP; Osborn, PA [address], , [place] [address] [phone] Mon-Fri 8:00-5:00 Babies seen by providers at [facility] Accepting Medicaid  [place] ([address]) [facility] [place] Wendling, DO [address] [place], [place] [address] [phone] Mon-Fri 8:00-5:00 Babies seen by [facility] providers Does NOT accept Medicaid Limited availability, please call early in hospitalization to schedule follow-up [facility] Calderon, PA; Cummings, MD; Dillard, MD; Martin, PA; Olson, MD; VanDeven, PA [address], [place], [place] [address] [phone] Mon-Fri 8:30-5:00, Sat 9:00-12:00 Babies seen by providers at [facility] Accepting Medicaid Please register online then schedule online or call office [email or web address]) Hunter, NP; Kumar, MD; Martin [place], PA [address], [place], [place] [address] [phone] Mon-Fri 8:00-5:00 Babies seen by providers at [facility] Accepting Medicaid [facility] ([facility]) [place], MD; Kristi Fleenor, NP; West, MD [address], [place], [place] [address] [phone] Mon-Fri 8:00-5:30, Sat&Sun by appointment (phones open at  8:30) Babies seen by [facility] providers Accepting Medicaid Must be a first-time baby or sibling of current patient [facility] [place]  [address], [place], [place]  [address] [phone]   Fax - [phone]  [place] ([address]) [facility] Brown, PA; Cowen, PA; Rice, MD; Helton, PA; Spry, MD [address] [place], [place] [address] [phone] Mon-Thur 8:00-7:00, Fri 8:00-5:00, Sat 8:00-12:00, Sun 9:00-12:00 Babies seen by [facility] providers Accepting Medicaid [facility] [place] Coe-Goins, MD; Marshall, MD; Pierre-Louis, MD [address] [place] [address], [place], [place] [address] [phone] Mon-Thur 8:00-5:00 Babies seen by providers at [facility] Accepting Medicaid [facility] Bratton, MD; Coe-Goins, MD; Hayes, MD; Lewis, MD; List, MD; Lott, MD; Marshall, MD; Moran, MD; O'Neal, MD; Pierre-Louis, MD; Pitonzo, MD; Scholer, MD; Spangle, MD [address] [place], [place] [address] [phone] Mon-Fri 8:00-5:30, Sat (Oct.-Mar.) 9:00-1:00 Babies seen by providers at [facility] Accepting Medicaid Must fill out new patient packet, available online at [email or web address] [facility] ([facility]) Friddle, NP; Harris, NP; Kelly, NP; Logan, MD; Melvin, PA; Poth, MD; Ramadoss, MD; Stanton, NP [address], [place], [place] [address] [phone] Mon-Thur 8:00-5:30, Fri 8:00-5:00 Babies seen by providers at [facility] Accepting Medicaid  [place] ([address]) [facility] Dixon, PA; Katie, MD; Pickard, MD; Tapia, PA [address], [place], [place] [address] [phone] Mon-Fri 8:00-5:00 Babies seen by providers at [facility] Accepting Medicaid   [place] ([address]) [facility] [place] Masneri, DO; Meyers, MD; Nelson, PA [address], [place], [place] [address] [phone] Mon-Fri 8:00-5:00 Babies seen by providers at [facility] Does NOT accept Medicaid Limited appointment availability, please call early in hospitalization  [facility] [place] Kunedd, DO; McGowen, MD [address], [place], [place] [address] [phone] Mon-Fri 8:00-5:00 Babies seen by [facility] providers Does NOT accept Medicaid [facility] [place] Cameron, MD; MacDonald, MD; Michaels, PA; Nayak, MD [address] [place] [address], [place], [place] [address] [phone] Mon-Fri 8:00-5:00 After hours clinic ([address] [place],  [address]) [phone] Mon-Fri 5:00-8:00, Sat 12:00-6:00, Sun 10:00-4:00 Babies seen by [facility] providers Accepting Medicaid [facility] [place] [address], [place], [place]  [address] [phone]   Fax - [phone]  [place] ([address]) [facility] [place] [facility] Andy, MD [address], [place], [place] [address] [phone] Mon-Fri 8:00-5:00 Babies seen by [facility] providers Does NOT accept Medicaid [facility] [place] ([facility] [place]) Eksir, MD [address], [place], [place] [address] [phone] Mon-Thur 8:00-7:00, Fri 8:00-5:00, Sat 8:00-12:00 Babies seen by providers at [facility] Accepting Medicaid - but does not have vaccinations in office (must be received elsewhere) Limited availability, please call early in hospitalization  [place] ([address]) [facility]  Charlene Flemming, MD [address],  [place] [address] [phone]  Fax [phone]  [facility]  Kimberly Newton, MD, Annamarie Streilein, PA, Carla Hampton, PA [address] [place], [place]  [address] [phone] [facility]  [address], [place], [place] [address] [phone] [address], [place], [place] [address] [phone] (West Office)  [facility] [address], [place], [place] [address] [phone] [facility] [address], [place], [place] [address] [phone] [facility] [address], [place], [place] [address] [phone] [facility] [address], [place], [place] [address] [phone] [facility] [address], [place], [place] [address] [phone] [facility] [address], [place], [place] [address] [phone] [facility]  [address], [place], [place] [address] [phone] Dr[facility] [address] [place] [address], [place], [place] [address] [phone] [facility] [address], [address], [place],  [address] [phone] [facility] [address], [place],  [address] [phone]  

## 2022-02-25 ENCOUNTER — Encounter: Payer: Self-pay | Admitting: Certified Nurse Midwife

## 2022-02-25 ENCOUNTER — Encounter (HOSPITAL_COMMUNITY): Payer: Self-pay | Admitting: Obstetrics and Gynecology

## 2022-02-25 ENCOUNTER — Encounter: Payer: Self-pay | Admitting: Family Medicine

## 2022-02-25 ENCOUNTER — Inpatient Hospital Stay (HOSPITAL_COMMUNITY)
Admission: AD | Admit: 2022-02-25 | Discharge: 2022-02-26 | DRG: 768 | Disposition: A | Discharge status: Home / Self Care | Payer: Medicaid Other | Attending: Obstetrics & Gynecology | Admitting: Obstetrics & Gynecology

## 2022-02-25 DIAGNOSIS — O9081 Anemia of the puerperium: Secondary | ICD-10-CM | POA: Diagnosis not present

## 2022-02-25 DIAGNOSIS — F3289 Other specified depressive episodes: Secondary | ICD-10-CM

## 2022-02-25 DIAGNOSIS — D6800 Von Willebrand disease, unspecified: Secondary | ICD-10-CM | POA: Diagnosis present

## 2022-02-25 DIAGNOSIS — D62 Acute posthemorrhagic anemia: Secondary | ICD-10-CM | POA: Diagnosis not present

## 2022-02-25 DIAGNOSIS — O0990 Supervision of high risk pregnancy, unspecified, unspecified trimester: Secondary | ICD-10-CM

## 2022-02-25 DIAGNOSIS — O99513 Diseases of the respiratory system complicating pregnancy, third trimester: Secondary | ICD-10-CM | POA: Diagnosis present

## 2022-02-25 DIAGNOSIS — O99113 Other diseases of the blood and blood-forming organs and certain disorders involving the immune mechanism complicating pregnancy, third trimester: Secondary | ICD-10-CM | POA: Diagnosis present

## 2022-02-25 DIAGNOSIS — O9912 Other diseases of the blood and blood-forming organs and certain disorders involving the immune mechanism complicating childbirth: Secondary | ICD-10-CM | POA: Diagnosis present

## 2022-02-25 DIAGNOSIS — F419 Anxiety disorder, unspecified: Secondary | ICD-10-CM

## 2022-02-25 DIAGNOSIS — O99843 Bariatric surgery status complicating pregnancy, third trimester: Secondary | ICD-10-CM | POA: Diagnosis present

## 2022-02-25 DIAGNOSIS — Z3A35 35 weeks gestation of pregnancy: Secondary | ICD-10-CM

## 2022-02-25 DIAGNOSIS — O4703 False labor before 37 completed weeks of gestation, third trimester: Principal | ICD-10-CM | POA: Diagnosis present

## 2022-02-25 DIAGNOSIS — J45909 Unspecified asthma, uncomplicated: Secondary | ICD-10-CM | POA: Diagnosis present

## 2022-02-25 DIAGNOSIS — O99844 Bariatric surgery status complicating childbirth: Secondary | ICD-10-CM | POA: Diagnosis present

## 2022-02-25 MED ORDER — LACTATED RINGERS IV BOLUS
1000.0000 mL | Freq: Once | INTRAVENOUS | Status: AC
  Administered 2022-02-25: 1000 mL via INTRAVENOUS

## 2022-02-25 NOTE — MAU Provider Note (Incomplete)
History     CSN: [id number]  Arrival date and time: 02/25/22 2213   Event Date/Time   First Provider Initiated Contact with Patient 02/25/22 2259      Chief Complaint  Patient presents with  . Back Pain   Brenda Ramirez  is a 21 y.o. G1P0 at [redacted]w[redacted]d who presents today with contractions. She states that she has had so many various pains (back pain and abdominal pain) that she doesn't really know when they started. However, she has had them since just prior to deciding to come in here today. She denies any leaking fluid. She has had some pink spotting with wiping. She reports normal fetal movement.  Back Pain This is a new problem. The current episode started yesterday. The problem occurs intermittently. The problem is unchanged. The pain is present in the lumbar spine. The quality of the pain is described as cramping. The pain does not radiate. The pain is moderate. Risk factors include pregnancy.    OB History     Gravida  1   Para      Term      Preterm      AB      Living         SAB      IAB      Ectopic      Multiple      Live Births              Past Medical History:  Diagnosis Date  . Anxiety   . Asthma   . Depression   . Von Willebrand disease ([place])    Tested for in 2018    Past Surgical History:  Procedure Laterality Date  . BARIATRIC SURGERY  02/05/2020    Family History  Problem Relation Age of Onset  . Diabetes Father   . Asthma Sister   . Asthma Sister     Social History   Tobacco Use  . Smoking status: Never  . Smokeless tobacco: Never  Vaping Use  . Vaping Use: Never used  Substance Use Topics  . Alcohol use: Never  . Drug use: Never    Allergies:  Allergies  Allergen Reactions  . Pineapple     Medications Prior to Admission  Medication Sig Dispense Refill Last Dose  . Prenatal Vit-Fe Fumarate-FA (PRENATAL VITAMIN) 27-0.8 MG TABS Take 1 tablet by mouth daily. 30 tablet 8 02/25/2022  . albuterol  (VENTOLIN HFA) 108 (90 Base) MCG/ACT inhaler Inhale 2 puffs into the lungs every 6 (six) hours as needed for wheezing or shortness of breath. (Patient not taking: Reported on 09/22/2021) 8 g 2   . busPIRone (BUSPAR) 7.5 MG tablet Take 1 tablet (7.5 mg total) by mouth 3 (three) times daily. (Patient not taking: Reported on 09/22/2021) 90 tablet 2   . cyclobenzaprine (FLEXERIL) 5 MG tablet Take 1 tablet (5 mg total) by mouth 3 (three) times daily as needed for muscle spasms. (Patient not taking: Reported on 12/15/2021) 20 tablet 0   . ferrous sulfate (FERROUSUL) 325 (65 FE) MG tablet Take 1 tablet (325 mg total) by mouth 2 (two) times daily. (Patient not taking: Reported on 11/03/2021) 60 tablet 1   . vitamin B-12 (CYANOCOBALAMIN) 100 MCG tablet Take 1 tablet (100 mcg total) by mouth daily. (Patient not taking: Reported on 09/22/2021) 90 tablet 3   . Vitamin D, Ergocalciferol, (DRISDOL) 1.25 MG (50000 UNIT) CAPS capsule Take 1 capsule (50,000 Units total) by mouth every 7 (seven)  days. (Patient not taking: Reported on 12/15/2021) 4 capsule 4     Review of Systems  Musculoskeletal:  Positive for back pain.  All other systems reviewed and are negative.  Physical Exam   Blood pressure 123/66, pulse 95, temperature 97.9 F (36.6 C), temperature source Oral, resp. rate 18, height 5\' 7"  (1.702 m), weight 98.4 kg, last menstrual period 06/14/2021, SpO2 99 %.  Physical Exam Constitutional:      Appearance: She is well-developed.  HENT:     Head: Normocephalic.  Eyes:     Pupils: Pupils are equal, round, and reactive to light.  Cardiovascular:     Rate and Rhythm: Normal rate and regular rhythm.     Heart sounds: Normal heart sounds.  Pulmonary:     Effort: Pulmonary effort is normal. No respiratory distress.     Breath sounds: Normal breath sounds.  Abdominal:     Palpations: Abdomen is soft.     Tenderness: There is no abdominal tenderness.  Genitourinary:    Vagina: No bleeding. Vaginal  discharge: mucusy.    Comments: External: no lesion Vagina: small amount of white discharge Dilation: 2 Effacement (%): 80 Station: -1 Exam by:: [email or web address], CNM   Musculoskeletal:        General: Normal range of motion.     Cervical back: Normal range of motion and neck supple.  Skin:    General: Skin is warm and dry.  Neurological:     Mental Status: She is alert and oriented to person, place, and time.  Psychiatric:        Mood and Affect: Mood normal.        Behavior: Behavior normal.     NST:  Baseline: *** Variability: *** Accels: *** Decels: *** Toco: *** Reactive/Appropriate for GA/***   MAU Course  Procedures  MDM ***  Assessment and Plan  ***  Thressa Sheller DNP, CNM  02/25/22  11:11 PM

## 2022-02-25 NOTE — MAU Note (Signed)
Pt says she saw blood when she wiped every time she went to b-room since 1pm [facility]- clinic Last sex- 1 week ago  Has back pain - started Wed  Says her legs hurt -  Feels lower abd pressure

## 2022-02-25 NOTE — MAU Provider Note (Addendum)
History     CSN: [id number]  Arrival date and time: 02/25/22 2213   Event Date/Time   First Provider Initiated Contact with Patient 02/25/22 2259      Chief Complaint  Patient presents with   Back Pain   Brenda Ramirez  is a 21 y.o. G1P0 at [redacted]w[redacted]d who presents today with contractions. She states that she has had so many various pains (back pain and abdominal pain) that she doesn't really know when they started. However, she has had them since just prior to deciding to come in here today. She denies any leaking fluid. She has had some pink spotting with wiping. She reports normal fetal movement.  Back Pain This is a new problem. The current episode started yesterday. The problem occurs intermittently. The problem is unchanged. The pain is present in the lumbar spine. The quality of the pain is described as cramping. The pain does not radiate. The pain is moderate. Risk factors include pregnancy.    OB History     Gravida  1   Para      Term      Preterm      AB      Living         SAB      IAB      Ectopic      Multiple      Live Births              Past Medical History:  Diagnosis Date   Anxiety    Asthma    Depression    Von Willebrand disease ([place])    Tested for in 2018    Past Surgical History:  Procedure Laterality Date   BARIATRIC SURGERY  02/05/2020    Family History  Problem Relation Age of Onset   Diabetes Father    Asthma Sister    Asthma Sister     Social History   Tobacco Use   Smoking status: Never   Smokeless tobacco: Never  Vaping Use   Vaping Use: Never used  Substance Use Topics   Alcohol use: Never   Drug use: Never    Allergies:  Allergies  Allergen Reactions   Pineapple     Medications Prior to Admission  Medication Sig Dispense Refill Last Dose   Prenatal Vit-Fe Fumarate-FA (PRENATAL VITAMIN) 27-0.8 MG TABS Take 1 tablet by mouth daily. 30 tablet 8 02/25/2022   albuterol (VENTOLIN HFA) 108 (90  Base) MCG/ACT inhaler Inhale 2 puffs into the lungs every 6 (six) hours as needed for wheezing or shortness of breath. (Patient not taking: Reported on 09/22/2021) 8 g 2    busPIRone (BUSPAR) 7.5 MG tablet Take 1 tablet (7.5 mg total) by mouth 3 (three) times daily. (Patient not taking: Reported on 09/22/2021) 90 tablet 2    cyclobenzaprine (FLEXERIL) 5 MG tablet Take 1 tablet (5 mg total) by mouth 3 (three) times daily as needed for muscle spasms. (Patient not taking: Reported on 12/15/2021) 20 tablet 0    ferrous sulfate (FERROUSUL) 325 (65 FE) MG tablet Take 1 tablet (325 mg total) by mouth 2 (two) times daily. (Patient not taking: Reported on 11/03/2021) 60 tablet 1    vitamin B-12 (CYANOCOBALAMIN) 100 MCG tablet Take 1 tablet (100 mcg total) by mouth daily. (Patient not taking: Reported on 09/22/2021) 90 tablet 3    Vitamin D, Ergocalciferol, (DRISDOL) 1.25 MG (50000 UNIT) CAPS capsule Take 1 capsule (50,000 Units total) by mouth every 7 (seven)  days. (Patient not taking: Reported on 12/15/2021) 4 capsule 4     Review of Systems  Musculoskeletal:  Positive for back pain.  All other systems reviewed and are negative.  Physical Exam   Blood pressure 123/66, pulse 95, temperature 97.9 F (36.6 C), temperature source Oral, resp. rate 18, height 5\' 7"  (1.702 m), weight 98.4 kg, last menstrual period 06/14/2021, SpO2 99 %.  Physical Exam Constitutional:      Appearance: She is well-developed.  HENT:     Head: Normocephalic.  Eyes:     Pupils: Pupils are equal, round, and reactive to light.  Cardiovascular:     Rate and Rhythm: Normal rate and regular rhythm.     Heart sounds: Normal heart sounds.  Pulmonary:     Effort: Pulmonary effort is normal. No respiratory distress.     Breath sounds: Normal breath sounds.  Abdominal:     Palpations: Abdomen is soft.     Tenderness: There is no abdominal tenderness.  Genitourinary:    Vagina: No bleeding. Vaginal discharge: mucusy.    Comments:  External: no lesion Vagina: small amount of white discharge Dilation: 2 Effacement (%): 80 Station: -1 Exam by:: [email or web address], CNM   Musculoskeletal:        General: Normal range of motion.     Cervical back: Normal range of motion and neck supple.  Skin:    General: Skin is warm and dry.  Neurological:     Mental Status: She is alert and oriented to person, place, and time.  Psychiatric:        Mood and Affect: Mood normal.        Behavior: Behavior normal.     NST:  Baseline: 145 Variability: moderate Accels: 15x15 Decels: none Toco: irregular contractions tracing, but no significant contraction palpated while patient is experiencing the pain she feeling.  Reactive/Appropriate for GA  Results for orders placed or performed during the hospital encounter of 02/25/22 (from the past 24 hour(s))  Urinalysis, Routine w reflex microscopic     Status: Abnormal   Collection Time: 02/25/22 11:11 PM  Result Value Ref Range   Color, Urine YELLOW YELLOW   APPearance CLEAR CLEAR   Specific Gravity, Urine 1.010 1.005 - 1.030   pH 7.0 5.0 - 8.0   Glucose, UA NEGATIVE NEGATIVE mg/dL   Hgb urine dipstick LARGE (A) NEGATIVE   Bilirubin Urine NEGATIVE NEGATIVE   Ketones, ur NEGATIVE NEGATIVE mg/dL   Protein, ur NEGATIVE NEGATIVE mg/dL   Nitrite NEGATIVE NEGATIVE   Leukocytes,Ua SMALL (A) NEGATIVE  Urinalysis, Microscopic (reflex)     Status: Abnormal   Collection Time: 02/25/22 11:11 PM  Result Value Ref Range   RBC / HPF >50 0 - 5 RBC/hpf   WBC, UA 0-5 0 - 5 WBC/hpf   Bacteria, UA RARE (A) NONE SEEN   Squamous Epithelial / LPF 6-10 0 - 5   Mucus PRESENT    MAU Course  Procedures  MDM Patient has had 1L LR bolus, and still uncomfortable.  Will give 10mg  flexeril and recheck  0037: Care turned to [facility], CNM    DNP, CNM  02/26/22  12:32 AM   0140: Reassessment: SVE 4/90/-1, BBOW, vtx. Pt reports less intense ctx but still regular. Plan for admit,  Dr. 02/28/22 and Dr. 11-24-1990 notified.  Assessment and Plan  Threatened PTL Admit to LD Mngt per labor team   Jolayne Panther, CNM  02/26/2022 12:46 AM

## 2022-02-26 ENCOUNTER — Encounter (HOSPITAL_COMMUNITY): Payer: Self-pay | Admitting: Anesthesiology

## 2022-02-26 ENCOUNTER — Encounter (HOSPITAL_COMMUNITY): Payer: Self-pay | Admitting: Obstetrics & Gynecology

## 2022-02-26 ENCOUNTER — Encounter (HOSPITAL_COMMUNITY): Payer: Self-pay | Admitting: Obstetrics and Gynecology

## 2022-02-26 ENCOUNTER — Inpatient Hospital Stay (HOSPITAL_COMMUNITY)
Admission: AD | Admit: 2022-02-26 | Discharge: 2022-02-28 | Disposition: A | Discharge status: Home / Self Care | Payer: Medicaid Other | Source: Home / Self Care | Attending: Obstetrics & Gynecology | Admitting: Obstetrics & Gynecology

## 2022-02-26 DIAGNOSIS — F3289 Other specified depressive episodes: Secondary | ICD-10-CM

## 2022-02-26 DIAGNOSIS — O4703 False labor before 37 completed weeks of gestation, third trimester: Principal | ICD-10-CM | POA: Diagnosis present

## 2022-02-26 DIAGNOSIS — F419 Anxiety disorder, unspecified: Secondary | ICD-10-CM

## 2022-02-26 DIAGNOSIS — J45909 Unspecified asthma, uncomplicated: Secondary | ICD-10-CM | POA: Diagnosis present

## 2022-02-26 DIAGNOSIS — O9081 Anemia of the puerperium: Secondary | ICD-10-CM | POA: Diagnosis not present

## 2022-02-26 DIAGNOSIS — D62 Acute posthemorrhagic anemia: Secondary | ICD-10-CM | POA: Diagnosis not present

## 2022-02-26 DIAGNOSIS — O9912 Other diseases of the blood and blood-forming organs and certain disorders involving the immune mechanism complicating childbirth: Secondary | ICD-10-CM | POA: Diagnosis present

## 2022-02-26 DIAGNOSIS — O0990 Supervision of high risk pregnancy, unspecified, unspecified trimester: Principal | ICD-10-CM

## 2022-02-26 DIAGNOSIS — D6800 Von Willebrand disease, unspecified: Secondary | ICD-10-CM | POA: Diagnosis present

## 2022-02-26 DIAGNOSIS — O99844 Bariatric surgery status complicating childbirth: Secondary | ICD-10-CM | POA: Diagnosis present

## 2022-02-26 DIAGNOSIS — Z3A35 35 weeks gestation of pregnancy: Secondary | ICD-10-CM

## 2022-02-26 DIAGNOSIS — O99513 Diseases of the respiratory system complicating pregnancy, third trimester: Secondary | ICD-10-CM | POA: Diagnosis present

## 2022-02-26 LAB — DIC (DISSEMINATED INTRAVASCULAR COAGULATION)PANEL
D-Dimer, Quant: 1.79 ug/mL-FEU — ABNORMAL HIGH (ref 0.00–0.50)
Fibrinogen: 596 mg/dL — ABNORMAL HIGH (ref 210–475)
INR: 1.1 (ref 0.8–1.2)
Platelets: 255 10*3/uL (ref 150–400)
Prothrombin Time: 14.3 seconds (ref 11.4–15.2)
Smear Review: NONE SEEN
aPTT: 29 seconds (ref 24–36)

## 2022-02-26 LAB — URINALYSIS, MICROSCOPIC (REFLEX): RBC / HPF: >50 RBC/hpf (ref 0–5)

## 2022-02-26 LAB — CBC
HCT: 28.3 % — ABNORMAL LOW (ref 36.0–46.0)
HCT: 30.1 % — ABNORMAL LOW (ref 36.0–46.0)
HCT: 25.3 % — ABNORMAL LOW (ref 36.0–46.0)
Hemoglobin: 8.8 g/dL — ABNORMAL LOW (ref 12.0–15.0)
Hemoglobin: 9.5 g/dL — ABNORMAL LOW (ref 12.0–15.0)
Hemoglobin: 8.2 g/dL — ABNORMAL LOW (ref 12.0–15.0)
MCH: 27.8 pg (ref 26.0–34.0)
MCH: 28.4 pg (ref 26.0–34.0)
MCH: 28.5 pg (ref 26.0–34.0)
MCHC: 31.1 g/dL (ref 30.0–36.0)
MCHC: 31.6 g/dL (ref 30.0–36.0)
MCHC: 32.4 g/dL (ref 30.0–36.0)
MCV: 89.6 fL (ref 80.0–100.0)
MCV: 89.9 fL (ref 80.0–100.0)
MCV: 87.8 fL (ref 80.0–100.0)
Platelets: 271 10*3/uL (ref 150–400)
Platelets: 212 10*3/uL (ref 150–400)
Platelets: 259 10*3/uL (ref 150–400)
RBC: 3.16 MIL/uL — ABNORMAL LOW (ref 3.87–5.11)
RBC: 3.35 MIL/uL — ABNORMAL LOW (ref 3.87–5.11)
RBC: 2.88 MIL/uL — ABNORMAL LOW (ref 3.87–5.11)
RDW: 13.7 % (ref 11.5–15.5)
RDW: 14.2 % (ref 11.5–15.5)
RDW: 14 % (ref 11.5–15.5)
WBC: 14.3 10*3/uL — ABNORMAL HIGH (ref 4.0–10.5)
WBC: 19.1 10*3/uL — ABNORMAL HIGH (ref 4.0–10.5)
WBC: 20.2 10*3/uL — ABNORMAL HIGH (ref 4.0–10.5)
nRBC: 0 % (ref 0.0–0.2)
nRBC: 0 % (ref 0.0–0.2)
nRBC: 0 % (ref 0.0–0.2)

## 2022-02-26 LAB — URINALYSIS, ROUTINE W REFLEX MICROSCOPIC
Bilirubin Urine: NEGATIVE
Glucose, UA: NEGATIVE mg/dL
Ketones, ur: NEGATIVE mg/dL
Nitrite: NEGATIVE
Protein, ur: NEGATIVE mg/dL
Specific Gravity, Urine: 1.01 (ref 1.005–1.030)
pH: 7 (ref 5.0–8.0)

## 2022-02-26 LAB — TYPE AND SCREEN
ABO/RH(D): A POS
Antibody Screen: NEGATIVE

## 2022-02-26 LAB — RPR: RPR Ser Ql: NONREACTIVE

## 2022-02-26 MED ORDER — OXYCODONE-ACETAMINOPHEN 5-325 MG PO TABS: 1.0000 | ORAL_TABLET | ORAL | Status: DC | PRN

## 2022-02-26 MED ORDER — LACTATED RINGERS IV SOLN: INTRAVENOUS | Status: DC

## 2022-02-26 MED ORDER — OXYTOCIN-SODIUM CHLORIDE 30-0.9 UT/500ML-% IV SOLN: 2.5000 [IU]/h | INTRAVENOUS | Status: DC

## 2022-02-26 MED ORDER — FENTANYL-BUPIVACAINE-NACL 0.5-0.125-0.9 MG/250ML-% EP SOLN
12.0000 mL/h | EPIDURAL | Status: DC | PRN
  Filled 2022-02-26: qty 250

## 2022-02-26 MED ORDER — OXYTOCIN-SODIUM CHLORIDE 30-0.9 UT/500ML-% IV SOLN
2.5000 [IU]/h | INTRAVENOUS | Status: DC
  Filled 2022-02-26: qty 500

## 2022-02-26 MED ORDER — DIPHENOXYLATE-ATROPINE 2.5-0.025 MG PO TABS
2.0000 | ORAL_TABLET | Freq: Four times a day (QID) | ORAL | Status: DC
  Administered 2022-02-26 (×2): 2 via ORAL
  Filled 2022-02-26 (×2): qty 2

## 2022-02-26 MED ORDER — CYCLOBENZAPRINE HCL 5 MG PO TABS
10.0000 mg | ORAL_TABLET | Freq: Once | ORAL | Status: AC
  Administered 2022-02-26: 10 mg via ORAL
  Filled 2022-02-26: qty 2

## 2022-02-26 MED ORDER — LIDOCAINE HCL (PF) 1 % IJ SOLN: 30.0000 mL | INTRAMUSCULAR | Status: DC | PRN

## 2022-02-26 MED ORDER — WITCH HAZEL-GLYCERIN EX PADS: 1.0000 | MEDICATED_PAD | CUTANEOUS | Status: DC | PRN

## 2022-02-26 MED ORDER — SENNOSIDES-DOCUSATE SODIUM 8.6-50 MG PO TABS
2.0000 | ORAL_TABLET | ORAL | Status: DC
  Administered 2022-02-27: 2 via ORAL
  Filled 2022-02-26 (×2): qty 2

## 2022-02-26 MED ORDER — ZOLPIDEM TARTRATE 5 MG PO TABS: 5.0000 mg | ORAL_TABLET | Freq: Every evening | ORAL | Status: DC | PRN

## 2022-02-26 MED ORDER — DIPHENHYDRAMINE HCL 50 MG/ML IJ SOLN: 12.5000 mg | INTRAMUSCULAR | Status: DC | PRN

## 2022-02-26 MED ORDER — CARBOPROST TROMETHAMINE 250 MCG/ML IM SOLN
250.0000 ug | INTRAMUSCULAR | Status: DC | PRN
  Administered 2022-02-26: 250 ug via INTRAMUSCULAR

## 2022-02-26 MED ORDER — LACTATED RINGERS IV SOLN
500.0000 mL | Freq: Once | INTRAVENOUS | Status: AC
  Administered 2022-02-26: 500 mL via INTRAVENOUS

## 2022-02-26 MED ORDER — TETANUS-DIPHTH-ACELL PERTUSSIS 5-2.5-18.5 LF-MCG/0.5 IM SUSY: 0.5000 mL | PREFILLED_SYRINGE | Freq: Once | INTRAMUSCULAR | Status: DC

## 2022-02-26 MED ORDER — OXYTOCIN BOLUS FROM INFUSION
333.0000 mL | Freq: Once | INTRAVENOUS | Status: AC
  Administered 2022-02-26: 333 mL via INTRAVENOUS

## 2022-02-26 MED ORDER — TRANEXAMIC ACID-NACL 1000-0.7 MG/100ML-% IV SOLN
INTRAVENOUS | Status: AC
  Filled 2022-02-26: qty 100

## 2022-02-26 MED ORDER — METHYLERGONOVINE MALEATE 0.2 MG/ML IJ SOLN
INTRAMUSCULAR | Status: AC
  Administered 2022-02-26: 0.2 mg
  Filled 2022-02-26: qty 1

## 2022-02-26 MED ORDER — ACETAMINOPHEN 325 MG PO TABS
650.0000 mg | ORAL_TABLET | ORAL | Status: DC | PRN
  Administered 2022-02-28: 650 mg via ORAL
  Filled 2022-02-26: qty 2

## 2022-02-26 MED ORDER — SOD CITRATE-CITRIC ACID 500-334 MG/5ML PO SOLN: 30.0000 mL | ORAL | Status: DC | PRN

## 2022-02-26 MED ORDER — FENTANYL CITRATE (PF) 100 MCG/2ML IJ SOLN
50.0000 ug | INTRAMUSCULAR | Status: DC | PRN
  Administered 2022-02-26: 100 ug via INTRAVENOUS
  Administered 2022-02-26: 50 ug via INTRAVENOUS
  Administered 2022-02-26: 100 ug via INTRAVENOUS
  Filled 2022-02-26 (×3): qty 2

## 2022-02-26 MED ORDER — EPHEDRINE 5 MG/ML INJ: 10.0000 mg | INTRAVENOUS | Status: DC | PRN

## 2022-02-26 MED ORDER — DIPHENHYDRAMINE HCL 25 MG PO CAPS
25.0000 mg | ORAL_CAPSULE | Freq: Four times a day (QID) | ORAL | Status: DC | PRN
  Administered 2022-02-28: 25 mg via ORAL
  Filled 2022-02-26: qty 1

## 2022-02-26 MED ORDER — OXYTOCIN BOLUS FROM INFUSION: 333.0000 mL | Freq: Once | INTRAVENOUS | Status: DC

## 2022-02-26 MED ORDER — CARBOPROST TROMETHAMINE 250 MCG/ML IM SOLN
INTRAMUSCULAR | Status: AC
  Filled 2022-02-26: qty 1

## 2022-02-26 MED ORDER — PENICILLIN G POT IN DEXTROSE 60000 UNIT/ML IV SOLN: 3.0000 10*6.[IU] | INTRAVENOUS | Status: DC

## 2022-02-26 MED ORDER — COCONUT OIL OIL: 1.0000 | TOPICAL_OIL | Status: DC | PRN

## 2022-02-26 MED ORDER — ONDANSETRON 4 MG PO TBDP
4.0000 mg | ORAL_TABLET | Freq: Once | ORAL | Status: AC
  Administered 2022-02-26: 4 mg via ORAL
  Filled 2022-02-26: qty 1

## 2022-02-26 MED ORDER — OXYCODONE-ACETAMINOPHEN 5-325 MG PO TABS: 2.0000 | ORAL_TABLET | ORAL | Status: DC | PRN

## 2022-02-26 MED ORDER — MISOPROSTOL 200 MCG PO TABS
ORAL_TABLET | ORAL | Status: AC
  Administered 2022-02-26: 800 ug
  Filled 2022-02-26: qty 4

## 2022-02-26 MED ORDER — PRENATAL MULTIVITAMIN CH
1.0000 | ORAL_TABLET | Freq: Every day | ORAL | Status: DC
  Administered 2022-02-27 – 2022-02-28 (×2): 1 via ORAL
  Filled 2022-02-26 (×2): qty 1

## 2022-02-26 MED ORDER — LACTATED RINGERS IV SOLN: 500.0000 mL | INTRAVENOUS | Status: DC | PRN

## 2022-02-26 MED ORDER — ACETAMINOPHEN 325 MG PO TABS: 650.0000 mg | ORAL_TABLET | ORAL | Status: DC | PRN

## 2022-02-26 MED ORDER — DIPHENOXYLATE-ATROPINE 2.5-0.025 MG/5ML PO LIQD
10.0000 mL | Freq: Four times a day (QID) | ORAL | Status: DC | PRN
  Filled 2022-02-26: qty 10

## 2022-02-26 MED ORDER — SODIUM CHLORIDE 0.9 % IV SOLN: 5.0000 10*6.[IU] | Freq: Once | INTRAVENOUS | Status: DC

## 2022-02-26 MED ORDER — IBUPROFEN 600 MG PO TABS
600.0000 mg | ORAL_TABLET | Freq: Four times a day (QID) | ORAL | Status: DC
  Administered 2022-02-26 – 2022-02-28 (×7): 600 mg via ORAL
  Filled 2022-02-26 (×7): qty 1

## 2022-02-26 MED ORDER — PHENYLEPHRINE 80 MCG/ML (10ML) SYRINGE FOR IV PUSH (FOR BLOOD PRESSURE SUPPORT): 80.0000 ug | PREFILLED_SYRINGE | INTRAVENOUS | Status: DC | PRN

## 2022-02-26 MED ORDER — SIMETHICONE 80 MG PO CHEW: 80.0000 mg | CHEWABLE_TABLET | ORAL | Status: DC | PRN

## 2022-02-26 MED ORDER — ONDANSETRON HCL 4 MG/2ML IJ SOLN: 4.0000 mg | Freq: Four times a day (QID) | INTRAMUSCULAR | Status: DC | PRN

## 2022-02-26 MED ORDER — LACTATED RINGERS IV BOLUS
1000.0000 mL | Freq: Once | INTRAVENOUS | Status: AC
  Administered 2022-02-26: 1000 mL via INTRAVENOUS

## 2022-02-26 MED ORDER — PENICILLIN G POT IN DEXTROSE 60000 UNIT/ML IV SOLN
3.0000 10*6.[IU] | INTRAVENOUS | Status: DC
  Administered 2022-02-26: 3 10*6.[IU] via INTRAVENOUS
  Filled 2022-02-26: qty 50

## 2022-02-26 MED ORDER — ONDANSETRON HCL 4 MG/2ML IJ SOLN
4.0000 mg | Freq: Four times a day (QID) | INTRAMUSCULAR | Status: DC | PRN
  Administered 2022-02-26: 4 mg via INTRAVENOUS
  Filled 2022-02-26: qty 2

## 2022-02-26 MED ORDER — SODIUM CHLORIDE 0.9 % IV SOLN
5.0000 10*6.[IU] | Freq: Once | INTRAVENOUS | Status: AC
  Administered 2022-02-26: 5 10*6.[IU] via INTRAVENOUS
  Filled 2022-02-26: qty 5

## 2022-02-26 MED ORDER — BENZOCAINE-MENTHOL 20-0.5 % EX AERO
1.0000 | INHALATION_SPRAY | CUTANEOUS | Status: DC | PRN
  Filled 2022-02-26 (×2): qty 56

## 2022-02-26 MED ORDER — ONDANSETRON HCL 4 MG/2ML IJ SOLN: 4.0000 mg | INTRAMUSCULAR | Status: DC | PRN

## 2022-02-26 MED ORDER — DIBUCAINE (PERIANAL) 1 % EX OINT: 1.0000 | TOPICAL_OINTMENT | CUTANEOUS | Status: DC | PRN

## 2022-02-26 MED ORDER — SODIUM CHLORIDE 0.9 % IV SOLN
20.0000 ug | Freq: Once | INTRAVENOUS | Status: AC
  Administered 2022-02-26: 20 ug via INTRAVENOUS
  Filled 2022-02-26: qty 5

## 2022-02-26 MED ORDER — ONDANSETRON HCL 4 MG PO TABS: 4.0000 mg | ORAL_TABLET | ORAL | Status: DC | PRN

## 2022-02-26 NOTE — Lactation Note (Signed)
This note was copied from a baby's chart. Lactation Consultation Note  Patient Name: Brenda Ramirez MVEHM'C Date: 02/26/2022   Age:19 hours  RN stated mother is not ready to be seen at this time.      Vivianne Master [facility] 02/26/2022, 5:57 PM

## 2022-02-26 NOTE — Progress Notes (Signed)
S: [name] is a 21 y.o. G1P0 at [redacted]w[redacted]d  who presents to MAU today complaining contractions q1-2 minutes since 1200. She denies vaginal bleeding. She denies LOF. She reports normal fetal movement.    O: LMP 06/14/2021  GENERAL: Well-developed, well-nourished female in no acute distress.  HEAD: Normocephalic, atraumatic.  CHEST: Normal effort of breathing, regular heart rate ABDOMEN: Soft, nontender, gravid  Cervical exam:  Dilation: 5.5 Effacement (%): 100 Presentation: Vertex Exam by:: Len Blalock, cNM   Fetal Monitoring: Baseline: 150 Variability: moderate Accelerations: none Decelerations: none Contractions: 1-2   A: SIUP at [redacted]w[redacted]d  Active labor  P: Admit to labor and delivery Report called to labor team  Wende Mott, [place] 02/26/2022 2:53 PM

## 2022-02-26 NOTE — Discharge Summary (Signed)
Physician Discharge Summary  Patient ID: Brenda Ramirez MRN: [id number] DOB/AGE: 23-Dec-2000 21 y.o.  Admit date: 02/25/2022 Discharge date: 02/26/2022  Admission Diagnoses:  Discharge Diagnoses:  Principal Problem:   Threatened preterm labor, third trimester   Discharged Condition: stable  Hospital Course: Patient admitted for preterm labor.  She experienced cervical change after her 1st 2 hours of evaluation, then no change.  Occasional contraction graphed.  Patient evaluated   Consults: None  Significant Diagnostic Studies: labs: Admit   Treatments: IV hydration and antibiotics: PCN for GBS prophylaxis  Discharge Exam: Blood pressure (!) 112/57, pulse 80, temperature 98.9 F (37.2 C), temperature source Oral, resp. rate 18, height 5\' 7"  (1.702 m), weight 98.4 kg, last menstrual period 06/14/2021, SpO2 99 %. General appearance: alert, cooperative, fatigued, and no distress Head: Normocephalic, without obvious abnormality, atraumatic Resp: NO respiratory distress noted. Rate normal Cardio: regular rate and rhythm Pelvic: cervix normal in appearance, external genitalia normal, and Cervical exam as documented Extremities: extremities normal, atraumatic, no cyanosis or edema  Disposition: Discharge disposition: 01-Home or Self Care       Discharge Instructions     Discharge patient   Complete by: As directed    Discharge disposition: 01-Home or Self Care   Discharge patient date: 02/26/2022      Allergies as of 02/26/2022       Reactions   Pineapple         Medication List     TAKE these medications    albuterol 108 (90 Base) MCG/ACT inhaler Commonly known as: VENTOLIN HFA Inhale 2 puffs into the lungs every 6 (six) hours as needed for wheezing or shortness of breath.   busPIRone 7.5 MG tablet Commonly known as: BUSPAR Take 1 tablet (7.5 mg total) by mouth 3 (three) times daily.   cyclobenzaprine 5 MG tablet Commonly known as: FLEXERIL Take  1 tablet (5 mg total) by mouth 3 (three) times daily as needed for muscle spasms.   ferrous sulfate 325 (65 FE) MG tablet Commonly known as: FerrouSul Take 1 tablet (325 mg total) by mouth 2 (two) times daily.   Prenatal Vitamin 27-0.8 MG Tabs Take 1 tablet by mouth daily.   vitamin B-12 100 MCG tablet Commonly known as: CYANOCOBALAMIN Take 1 tablet (100 mcg total) by mouth daily.   Vitamin D (Ergocalciferol) 1.25 MG (50000 UNIT) Caps capsule Commonly known as: DRISDOL Take 1 capsule (50,000 Units total) by mouth every 7 (seven) days.        Follow-up Information     Chancy Milroy, MD Follow up.   Specialty: Obstetrics and Gynecology Why: office will call you or for scheduled appointment on 9/28 Contact information: [place] [address] [phone]                 Signed: Maryann Conners MSN, [place] Provider, [facility] 02/26/2022, 9:28 AM

## 2022-02-26 NOTE — Lactation Note (Signed)
This note was copied from a baby's chart. Lactation Consultation Note  Patient Name: Brenda Ramirez YSHUO'H Date: 02/26/2022 Reason for consult: Initial assessment;Primapara;Infant < 6lbs;Late-preterm 34-36.6wks Age:19 hours Mom holding baby stated she is trying to BF but could baby to latch. LC un-swaddled baby, placed baby STS, pillows for props, baby was tongue thrusting at first, would latch suckle then tongue thrust nipple out of mouth. Baby finally figured out how to [employer]. Baby BF well.  Gave mom LPI and reviewed information. Importance of pumping and supplementing. Mom states understanding. Discussed w/mom needing to pump. Mom agreed.  Mom shown how to use DEBP & how to disassemble, clean, & reassemble parts. Mom knows to pump q3h for 15-20 min.  Hand expression w/colostrum noted. Mom getting very sleepy while BF. Encouraged mom to call for assistance as needed. FOB reviewed as well since mom is getting sleepy.  Maternal Data Has patient been taught Hand Expression?: Yes Does the patient have breastfeeding experience prior to this delivery?: No  Feeding    LATCH Score Latch: Grasps breast easily, tongue down, lips flanged, rhythmical sucking.  Audible Swallowing: A few with stimulation  Type of Nipple: Everted at rest and after stimulation  Comfort (Breast/Nipple): Soft / non-tender  Hold (Positioning): Assistance needed to correctly position infant at breast and maintain latch.  LATCH Score: 8   Lactation Tools Discussed/Used Tools: Pump;Flanges Flange Size: 21 Breast pump type: Double-Electric Breast Pump Pump Education: Setup, frequency, and cleaning;Milk Storage Reason for Pumping: LPI Pumping frequency: q3h  Interventions Interventions: Breast feeding basics reviewed;Adjust position;DEBP;Assisted with latch;Support pillows;Skin to skin;Position options;Breast massage;Hand express;Breast compression;LPT handout/interventions;LC Services  brochure  Discharge    Consult Status Consult Status: Follow-up Date: 02/27/22 Follow-up type: In-patient    Theodoro Kalata 02/26/2022, 9:52 PM

## 2022-02-26 NOTE — Progress Notes (Signed)
Brenda Ramirez MRN: [id number]  Subjective: -Patient not coping well with contractions. Requesting epidural, but anesthesia performing further testing in regards to Von Willebrand diagnosis.  Patient reports urge to push with contractions. Family at bedside.  Objective: BP 137/82   Pulse 95   Temp 98.9 F (37.2 C) (Oral)   Resp (!) 21   Ht 5\' 7"  (1.702 m)   Wt 97.8 kg   LMP 06/14/2021   SpO2 100%   BMI 33.77 kg/m  No intake/output data recorded. No intake/output data recorded.  Fetal Monitoring: FHT: 125 bpm, Mod Var, -Decels, -Accels UC: Q1-56min, palpates moderate    Vaginal Exam: SVE:   Dilation: 9 Effacement (%): 100 Exam by:: Majid Mccravy CMN Membranes:Bulging Internal Monitors: None  Augmentation/Induction: Pitocin:None Cytotec: None  Assessment:  IUP at 35.4 weeks Cat I FT  Preterm Labor  Plan: -Awaiting anesthesia  -Anticipate SVD  Riley Churches, CNM Advanced Practice Provider, [facility] 02/26/2022, 5:32 PM

## 2022-02-26 NOTE — H&P (Signed)
Brenda Ramirez is a 21 y.o. female presenting for active labor.  Patient was discharged at 0900 due to stalled PTL. Patient returned at 1400 reporting onset of strong contractions at 1300.  OB History     Gravida  1   Para      Term      Preterm      AB      Living         SAB      IAB      Ectopic      Multiple      Live Births             Past Medical History:  Diagnosis Date   Anxiety    Asthma    Depression    Von Willebrand disease ([place])    Tested for in 2018   Past Surgical History:  Procedure Laterality Date   BARIATRIC SURGERY  02/05/2020   Family History: family history includes Asthma in her sister and sister; Diabetes in her father. Social History:  reports that she has never smoked. She has never used smokeless tobacco. She reports that she does not drink alcohol and does not use drugs.     Maternal Diabetes: Unsure-GTT pending Genetic Screening: Normal Maternal Ultrasounds/Referrals: Normal Fetal Ultrasounds or other Referrals:  None Maternal Substance Abuse:  No Significant Maternal Medications:  None Significant Maternal Lab Results:  Other: Von Willebrand  Number of Prenatal Visits:greater than 3 verified prenatal visits Other Comments:  None  Review of Systems  Gastrointestinal:  Positive for abdominal pain (Ctx). Negative for constipation, diarrhea, nausea and vomiting.  Genitourinary:  Negative for vaginal bleeding and vaginal discharge.   Maternal Medical History:  Reason for admission: Nausea.    Dilation: 5.5 Effacement (%): 100 Exam by:: Len Blalock, cNM Last menstrual period 06/14/2021. Maternal Exam:  Uterine Assessment: Contraction strength is moderate.  Abdomen: Patient reports no abdominal tenderness. Fetal presentation: vertex Pelvis: adequate for delivery.     Fetal Exam Fetal Monitor Review: Baseline rate: 145.  Variability: moderate (6-25 bpm).   Pattern: no decelerations and no  accelerations.   Fetal State Assessment: Category I - tracings are normal.   Physical Exam Vitals reviewed.  Constitutional:      General: She is in acute distress.     Appearance: Normal appearance.  HENT:     Head: Normocephalic and atraumatic.  Eyes:     Conjunctiva/sclera: Conjunctivae normal.  Cardiovascular:     Rate and Rhythm: Normal rate.  Pulmonary:     Effort: Pulmonary effort is normal. No respiratory distress.  Musculoskeletal:        General: Normal range of motion.     Cervical back: Normal range of motion.     Right lower leg: No edema.     Left lower leg: No edema.  Skin:    General: Skin is warm and dry.  Neurological:     Mental Status: She is alert and oriented to person, place, and time.  Psychiatric:        Mood and Affect: Mood normal.        Behavior: Behavior normal.     Prenatal labs: ABO, Rh: --/--/A POS (09/23 0140) Antibody: NEG (09/23 0140) Rubella: 8.30 (03/16 1609) RPR: NON REACTIVE (09/23 0144)  HBsAg: Negative (03/16 1609)  HIV: Non Reactive (07/26 1146)  GBS:     Assessment/Plan: 21 year old G1P0 at 35.4 weeks Preterm Labor GBS Unknown Von Willebrand  -Admit to  Birthing Suites  -Routine Labor and Delivery Orders per Protocol -In room to complete assessment and discuss POC: -Start PCN at as bolus dose previously given.  -Okay for epidural as desired    Cherre Robins 02/26/2022, 2:55 PM

## 2022-02-26 NOTE — Progress Notes (Signed)
Patient ID: Brenda Ramirez, female   DOB: 03-19-2001, 21 y.o.   MRN: [id number]  Subjective: -Patient resting in bed.  Reports comfort. Endorses fetal movement. Family at bedside, supportive.   Objective: BP (!) 112/57   Pulse 80   Temp 98.9 F (37.2 C) (Oral)   Resp 18   Ht 5\' 7"  (1.702 m)   Wt 98.4 kg   LMP 06/14/2021   SpO2 99%   BMI 33.97 kg/m  No intake/output data recorded. No intake/output data recorded.  Fetal Monitoring: FHT: 145 bpm, Mod Var, -Decels, +Accels UC: No ctx graphed or palpated    Vaginal Exam: SVE:   Dilation: 3.5 Effacement (%): 80 Station: Ballotable Exam by:: Milinda Cave, CNM Membranes:Intact Internal Monitors: None  Augmentation/Induction: Pitocin:None Cytotec: None  Assessment:  IUP at 35.4 weeks Cat I FT  Preterm Labor  Plan: -Von Willebrand Factor 8 Activity ordered.  -Discussed no cervical change and recommendation for discharge home. -Patient agreeable with plan. -Precautions given. -Monitor for 30 minutes and discharge if strip remains reactive.     Riley Churches, CNM Advanced Practice Provider, [facility] 02/26/2022, 9:11 AM

## 2022-02-26 NOTE — H&P (Signed)
OBSTETRIC ADMISSION HISTORY AND PHYSICAL  Brenda Ramirez is a 21 y.o. female G1P0 with IUP at [redacted]w[redacted]d by LMP presenting for SOL. She reports +FMs, No LOF, no VB, no blurry vision, headaches or peripheral edema, and RUQ pain.  She is going to breast and formula. She request Nexplanon for birth control. She received her prenatal care at [facility]   Dating: By LMP --->  Estimated Date of Delivery: 03/29/22  Sono:    @[redacted]w[redacted]d , CWD, normal anatomy, cephalic presentation, posterior placenta, 2246g, 51% EFW   Prenatal History/Complications: Von Willebrand disease   Past Medical History: Past Medical History:  Diagnosis Date   Anxiety    Asthma    Depression    Von Willebrand disease ([place])    Tested for in 2018    Past Surgical History: Past Surgical History:  Procedure Laterality Date   BARIATRIC SURGERY  02/05/2020    Obstetrical History: OB History     Gravida  1   Para      Term      Preterm      AB      Living         SAB      IAB      Ectopic      Multiple      Live Births              Social History Social History   Socioeconomic History   Marital status: Single    Spouse name: Not on file   Number of children: Not on file   Years of education: Not on file   Highest education level: Not on file  Occupational History   Not on file  Tobacco Use   Smoking status: Never   Smokeless tobacco: Never  Vaping Use   Vaping Use: Never used  Substance and Sexual Activity   Alcohol use: Never   Drug use: Never   Sexual activity: Yes    Birth control/protection: None  Other Topics Concern   Not on file  Social History Narrative   Not on file   Social Determinants of Health   Financial Resource Strain: Not on file  Food Insecurity: No Food Insecurity (02/26/2022)   Hunger Vital Sign    Worried About Running Out of Food in the Last Year: Never true    Ran Out of Food in the Last Year: Never true  Transportation Needs: No Transportation  Needs (02/26/2022)   PRAPARE - Hydrologist (Medical): No    Lack of Transportation (Non-Medical): No  Physical Activity: Not on file  Stress: Not on file  Social Connections: Not on file    Family History: Family History  Problem Relation Age of Onset   Diabetes Father    Asthma Sister    Asthma Sister     Allergies: Allergies  Allergen Reactions   Pineapple     Medications Prior to Admission  Medication Sig Dispense Refill Last Dose   Prenatal Vit-Fe Fumarate-FA (PRENATAL VITAMIN) 27-0.8 MG TABS Take 1 tablet by mouth daily. 30 tablet 8 02/25/2022   albuterol (VENTOLIN HFA) 108 (90 Base) MCG/ACT inhaler Inhale 2 puffs into the lungs every 6 (six) hours as needed for wheezing or shortness of breath. (Patient not taking: Reported on 09/22/2021) 8 g 2    busPIRone (BUSPAR) 7.5 MG tablet Take 1 tablet (7.5 mg total) by mouth 3 (three) times daily. (Patient not taking: Reported on 09/22/2021) 90 tablet 2  cyclobenzaprine (FLEXERIL) 5 MG tablet Take 1 tablet (5 mg total) by mouth 3 (three) times daily as needed for muscle spasms. (Patient not taking: Reported on 12/15/2021) 20 tablet 0    ferrous sulfate (FERROUSUL) 325 (65 FE) MG tablet Take 1 tablet (325 mg total) by mouth 2 (two) times daily. (Patient not taking: Reported on 11/03/2021) 60 tablet 1    vitamin B-12 (CYANOCOBALAMIN) 100 MCG tablet Take 1 tablet (100 mcg total) by mouth daily. (Patient not taking: Reported on 09/22/2021) 90 tablet 3    Vitamin D, Ergocalciferol, (DRISDOL) 1.25 MG (50000 UNIT) CAPS capsule Take 1 capsule (50,000 Units total) by mouth every 7 (seven) days. (Patient not taking: Reported on 12/15/2021) 4 capsule 4      Review of Systems   All systems reviewed and negative except as stated in HPI  Blood pressure 123/66, pulse 95, temperature 97.9 F (36.6 C), temperature source Oral, resp. rate 18, height 5\' 7"  (1.702 m), weight 98.4 kg, last menstrual period 06/14/2021, SpO2 99  %. General appearance: alert, cooperative, and mild distress Lungs: clear to auscultation bilaterally Heart: regular rate  Abdomen: soft, non-tender Extremities: Homans sign is negative, no sign of DVT Presentation:  Fetal monitoringBaseline: 150 bpm, Variability: Good {> 6 bpm), Accelerations: Reactive, and Decelerations: Absent Uterine activityNone Dilation: 4 Effacement (%): 90 Station: -1 Exam by:: Melaine Bhramhi,CNM   Prenatal labs: ABO, Rh: --/--/A POS (09/23 0140) Antibody: NEG (09/23 0140) Rubella: 8.30 (03/16 1609) RPR: Non Reactive (07/26 1146)  HBsAg: Negative (03/16 1609)  HIV: Non Reactive (07/26 1146)  GBS:    1 hr Glucola Normal  Genetic screening  LR female Anatomy 03-25-1975 normal   Prenatal Transfer Tool  Maternal Diabetes: No Genetic Screening: Normal Maternal Ultrasounds/Referrals: Normal Fetal Ultrasounds or other Referrals:  Referred to Materal Fetal Medicine  Maternal Substance Abuse:  No Significant Maternal Medications:  None Significant Maternal Lab Results:  Other: GBS unknown  Number of Prenatal Visits:greater than 3 verified prenatal visits Other Comments:  None  Results for orders placed or performed during the hospital encounter of 02/25/22 (from the past 24 hour(s))  Urinalysis, Routine w reflex microscopic   Collection Time: 02/25/22 11:11 PM  Result Value Ref Range   Color, Urine YELLOW YELLOW   APPearance CLEAR CLEAR   Specific Gravity, Urine 1.010 1.005 - 1.030   pH 7.0 5.0 - 8.0   Glucose, UA NEGATIVE NEGATIVE mg/dL   Hgb urine dipstick LARGE (A) NEGATIVE   Bilirubin Urine NEGATIVE NEGATIVE   Ketones, ur NEGATIVE NEGATIVE mg/dL   Protein, ur NEGATIVE NEGATIVE mg/dL   Nitrite NEGATIVE NEGATIVE   Leukocytes,Ua SMALL (A) NEGATIVE  Urinalysis, Microscopic (reflex)   Collection Time: 02/25/22 11:11 PM  Result Value Ref Range   RBC / HPF >50 0 - 5 RBC/hpf   WBC, UA 0-5 0 - 5 WBC/hpf   Bacteria, UA RARE (A) NONE SEEN   Squamous  Epithelial / LPF 6-10 0 - 5   Mucus PRESENT   Type and screen [facility]   Collection Time: 02/26/22  1:40 AM  Result Value Ref Range   ABO/RH(D) A POS    Antibody Screen NEG    Sample Expiration      03/01/2022,2359 Performed at [facility], 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 G1P0 at [redacted]w[redacted]d here forSOL   #Labor:Progressing spontaneously. Continue to monitor  #Pain: PRN epidural  #FWB: Cat 1  #ID:  GBS unkown, PCN  #MOF: both  #MOC:Nexplanon  #Circ:  No   Celedonio Savage, MD  02/26/2022, 2:50 AM

## 2022-02-26 NOTE — MAU Note (Addendum)
...  Brenda Ramirez is a 21 y.o. at [redacted]w[redacted]d here in MAU reporting: CTX with an urge to push. Sent home from L&D around 0930 this morning. GBS pending. Patient endorses bleeding. +FM.  Len Blalock, CNM, at bedside for SVE.  Pain score: 10/10 lower abdomen  FHT: 150 initial external

## 2022-02-26 NOTE — Discharge Summary (Signed)
Postpartum Discharge Summary  Date of Service updated***     Patient Name: Brenda Ramirez DOB: 2000/07/03 MRN: [id number]  Date of admission: 02/26/2022 Delivery date:02/26/2022  Delivering provider: Gerlene Fee  Date of discharge: 02/26/2022  Admitting diagnosis: Preterm labor [O60.00] Intrauterine pregnancy: [redacted]w[redacted]d    Secondary diagnosis:  Principal Problem:   Preterm labor Active Problems:   Preterm delivery   Vaginal delivery  Additional problems: ***    Discharge diagnosis: Preterm Pregnancy Delivered and PHeber-Overgaard                                             Post partum procedures:{Postpartum procedures:23558} Augmentation: N/A Complications: [id number] Hospital course: Onset of Labor With Vaginal Delivery      21y.o. yo G1P0101 at 370w4das admitted in Latent Labor on 02/26/2022. Patient had an uncomplicated labor course as follows:  Membrane Rupture Time/Date: 5:05 PM ,02/26/2022   Delivery Method:Vaginal, Spontaneous  Episiotomy: None  Lacerations:  None  Patient had a postpartum course complicated by PPPonderan L&D; EBL 1577. She was give TXA, methergin, cytotec, hemabate, and JADA***.  She is ambulating, tolerating a regular diet, passing flatus, and urinating well. Patient is discharged home in stable condition on 02/26/22.  Newborn Data: Birth date:02/26/2022  Birth time:5:16 PM  Gender:Female  Living status:Living  Apgars:8 ,9  [id number]   Magnesium Sulfate received: No BMZ received: No Rhophylac:No MMR:No T-DaP:{Tdap:23962} Flu: {F{ELF:81017}ransfusion:{Transfusion received:30440034}  Physical exam  Vitals:   02/26/22 1536 02/26/22 1540 02/26/22 1649 02/26/22 1651  BP:  (!) 140/84 137/82 137/82  Pulse:  71 95 95  Resp:      Temp:  98.9 F (37.2 C)    TempSrc:  Oral    SpO2:      Weight: 97.8 kg     Height: 5' 7"  (1.702 m)      General: {Exam; general:21111117} Lochia: {Desc;  appropriate/inappropriate:30686::"appropriate"} Uterine Fundus: {Desc; firm/soft:30687} Incision: {Exam; incision:21111123} DVT Evaluation: {Exam; dvt:2111122} Labs: Lab Results  Component Value Date   WBC 20.2 (H) 02/26/2022   HGB 8.2 (L) 02/26/2022   HCT 25.3 (L) 02/26/2022   MCV 87.8 02/26/2022   PLT 259 02/26/2022   PLT 255 02/26/2022      Latest Ref Rng & Units 01/18/2022   10:31 PM  CMP  Glucose 70 - 99 mg/dL 84   BUN 6 - 20 mg/dL 6   Creatinine 0.44 - 1.00 mg/dL 0.53   Sodium 135 - 145 mmol/L 136   Potassium 3.5 - 5.1 mmol/L 3.4   Chloride 98 - 111 mmol/L 109   CO2 22 - 32 mmol/L 22   Calcium 8.9 - 10.3 mg/dL 8.1   Total Protein 6.5 - 8.1 g/dL 6.6   Total Bilirubin 0.3 - 1.2 mg/dL 0.5   Alkaline Phos 38 - 126 U/L 71   AST 15 - 41 U/L 16   ALT 0 - 44 U/L 10    Edinburgh Score:     No data to display           After visit meds:  Allergies as of 02/26/2022       Reactions   Pineapple      Med Rec must be completed prior to using this [facility]        Discharge home in stable condition Infant Feeding: {Baby feeding:23562}  Infant Disposition:{CHL IP OB HOME WITH FOYDXA:12878} Discharge instruction: per After Visit Summary and Postpartum booklet. Activity: Advance as tolerated. Pelvic rest for 6 weeks.  Diet: routine diet Future Appointments: Future Appointments  Date Time Provider [place]  03/03/2022  9:55 AM Chancy Milroy, MD [facility]  03/09/2022  9:00 AM CENTERING PROVIDER [facility]  03/17/2022  3:30 PM [facility] NURSE [facility]  03/17/2022  3:45 PM [facility] US4 [facility] [place]   Follow up Visit:  Message sent to [facility] by [place] on 02/26/2022  Please schedule this patient for a Virtual postpartum visit in 6 weeks with the following provider: Any provider. Additional Postpartum F/U: n/a   High risk pregnancy complicated by:  Von willebrand disease Delivery mode:  Vaginal, Spontaneous  Anticipated Birth Control:   Depo   02/26/2022 Gerlene Fee, DO

## 2022-02-26 NOTE — Anesthesia Preprocedure Evaluation (Signed)
Anesthesia Evaluation    Airway        Dental   Pulmonary           Cardiovascular      Neuro/Psych    GI/Hepatic   Endo/Other    Renal/GU      Musculoskeletal   Abdominal   Peds  Hematology  (+) Blood dyscrasia, ,   Anesthesia Other Findings   Reproductive/Obstetrics                             Anesthesia Physical Anesthesia Plan  ASA:   Anesthesia Plan:    Post-op Pain Management:    Induction:   PONV Risk Score and Plan:   Airway Management Planned:   Additional Equipment:   Intra-op Plan:   Post-operative Plan:   Informed Consent:   Plan Discussed with:   Anesthesia Plan Comments: (Called for epidural in pt with Von Willebrand Dz. Went to talk to patient about what type of VWD she has and the patient is unaware. There are no notes in the system. She said she was tested at the age of 15 due to heavy periods which have continued into adulthood. Apparently the patient had a VW panel sent off on 9/20 but results were not back and was scheduled for hematology consult on 9/25. While she still endorses heavy periods she denies bleeding into joints or nosebleeds but states her gums bleed every other day while brushing her teeth. OB note from 9/14 states 2016 lab results :"Nml VWF Act and Ag, Previous low VWD Act and Ag w/ Nml Multimers suggestive of low VWF or mild VWD Type I". Due to story with bleeding gums I elected to get a TEG with normal results. Unfortunately by the time the results were back the patient was completely dilated and pushing. Expect SVD soon. Deatra Canter, MD)        Anesthesia Quick Evaluation

## 2022-02-27 ENCOUNTER — Other Ambulatory Visit: Payer: Self-pay

## 2022-02-27 ENCOUNTER — Encounter (HOSPITAL_COMMUNITY): Payer: Self-pay | Admitting: Obstetrics & Gynecology

## 2022-02-27 LAB — CBC
HCT: 16.9 % — ABNORMAL LOW (ref 36.0–46.0)
HCT: 20.3 % — ABNORMAL LOW (ref 36.0–46.0)
Hemoglobin: 5.5 g/dL — CL (ref 12.0–15.0)
Hemoglobin: 6.9 g/dL — CL (ref 12.0–15.0)
MCH: 28.5 pg (ref 26.0–34.0)
MCH: 29.6 pg (ref 26.0–34.0)
MCHC: 32.5 g/dL (ref 30.0–36.0)
MCHC: 34 g/dL (ref 30.0–36.0)
MCV: 87.6 fL (ref 80.0–100.0)
MCV: 87.1 fL (ref 80.0–100.0)
Platelets: 190 10*3/uL (ref 150–400)
Platelets: 174 10*3/uL (ref 150–400)
RBC: 1.93 MIL/uL — ABNORMAL LOW (ref 3.87–5.11)
RBC: 2.33 MIL/uL — ABNORMAL LOW (ref 3.87–5.11)
RDW: 14.5 % (ref 11.5–15.5)
RDW: 14.3 % (ref 11.5–15.5)
WBC: 20.4 10*3/uL — ABNORMAL HIGH (ref 4.0–10.5)
WBC: 15.4 10*3/uL — ABNORMAL HIGH (ref 4.0–10.5)
nRBC: 0 % (ref 0.0–0.2)
nRBC: 0 % (ref 0.0–0.2)

## 2022-02-27 LAB — RPR: RPR Ser Ql: NONREACTIVE

## 2022-02-27 LAB — CULTURE, OB URINE

## 2022-02-27 LAB — FACTOR 8 RISTOCETIN COFACTOR: Ristocetin Co-factor, Plasma: 127 % (ref 50–200)

## 2022-02-27 LAB — PREPARE RBC (CROSSMATCH)

## 2022-02-27 MED ORDER — ACETAMINOPHEN 325 MG PO TABS
650.0000 mg | ORAL_TABLET | Freq: Once | ORAL | Status: AC
  Administered 2022-02-27: 650 mg via ORAL
  Filled 2022-02-27: qty 2

## 2022-02-27 MED ORDER — DIPHENHYDRAMINE HCL 25 MG PO CAPS
25.0000 mg | ORAL_CAPSULE | Freq: Once | ORAL | Status: AC
  Administered 2022-02-27: 25 mg via ORAL
  Filled 2022-02-27: qty 1

## 2022-02-27 MED ORDER — SODIUM CHLORIDE 0.9% IV SOLUTION: Freq: Once | INTRAVENOUS | Status: AC

## 2022-02-27 MED ORDER — FUROSEMIDE 10 MG/ML IJ SOLN
20.0000 mg | Freq: Once | INTRAMUSCULAR | Status: DC
  Filled 2022-02-27: qty 4

## 2022-02-27 MED ORDER — MEDROXYPROGESTERONE ACETATE 150 MG/ML IM SUSP
150.0000 mg | Freq: Once | INTRAMUSCULAR | Status: AC
  Administered 2022-02-28: 150 mg via INTRAMUSCULAR
  Filled 2022-02-27: qty 1

## 2022-02-27 NOTE — Progress Notes (Signed)
CSW received consult for hx of Anxiety and Depression.  CSW met with MOB to offer support and complete assessment. When CSW entered room, MOB was observed sitting in hospital bed. Infant was asleep in bassinet. FOB was asleep on couch. CSW introduced self. CSW inquired if it would be better to come back at another time to speak with MOB alone. MOB provided verbal consent to speak in front of FOB about anything. CSW explained reason for consult and inquired how MOB has felt emotionally since giving birth. MOB reports she has felt "very good." MOB reports during pregnancy, she felt "all over the place" and "overwhelmed." MOB did not provide details why but shared that her anxiety improved some towards the end of her pregnancy.   CSW inquired about MOB's mental health history. MOB reports she was first diagnosed with depression and anxiety at age 67. MOB reports she took prozac at age 81 which she found was helpful. Per chart review, MOB was also taking buspirone in the beginning of her pregnancy for anxiety symptoms. MOB reports she saw a therapist for 6 months in 2019 which she found helpful but reports she is not currently in therapy. CSW provided information about [facility] home visiting program, explaining that the program offers in-home therapy services. CSW inquired if MOB is interested in a referral. MOB expressed interest and provided verbal consent for CSW to place referral. MOB reports she receives support from FOB and her mom. MOB denied current SI/HI. DV was not assessed due to FOB being present.   MOB reports she has all needed items for infant, including a car seat and bassinet. MOB has not yet chosen a pediatrician, CSW provided a list per MOB's request.   MOB reports she receives [facility]. CSW encouraged MOB to notify her caseworker of infant's birth to have infant added to benefits. MOB declined additional resource needs at this time.   CSW provided education regarding the baby blues period vs.  perinatal mood disorders, discussed treatment and gave resources for mental health follow up if concerns arise.  CSW recommends self-evaluation during the postpartum time period using the New Mom Checklist from Postpartum Progress and encouraged MOB to contact a medical professional if symptoms are noted at any time.    CSW provided review of Sudden Infant Death Syndrome (SIDS) precautions.    CSW identifies no further need for intervention and no barriers to discharge at this time.  Signed,  Berniece Salines, MSW, [place], [place] 02/27/2022 8:58 PM

## 2022-02-27 NOTE — Lactation Note (Signed)
This note was copied from a baby's chart. Lactation Consultation Note  Patient Name: Boy Asanti Craigo PZWCH'E Date: 02/27/2022 Reason for consult: Mother's request;Difficult latch;Primapara;Late-preterm 34-36.6wks Age:19 hours FOB came out and asked for latch assistance. Baby sleepy not wanting to open his mouth to feed. LC used stimulation to wake him up and suck training w/gloved finger. Baby finally latched and BF well. Heard swallows. Praised mom. After BF finished mom giving formula.  Maternal Data    Feeding Nipple Type: Nfant Slow Flow (purple)  LATCH Score Latch: Grasps breast easily, tongue down, lips flanged, rhythmical sucking.  Audible Swallowing: Spontaneous and intermittent  Type of Nipple: Everted at rest and after stimulation  Comfort (Breast/Nipple): Soft / non-tender  Hold (Positioning): Assistance needed to correctly position infant at breast and maintain latch.  LATCH Score: 9   Lactation Tools Discussed/Used    Interventions Interventions: Breast feeding basics reviewed;Adjust position;Assisted with latch;Support pillows;Skin to skin;Position options;Breast massage;Hand express;Breast compression  Discharge    Consult Status Consult Status: Follow-up Date: 02/28/22 Follow-up type: In-patient    Theodoro Kalata 02/27/2022, 2:36 AM

## 2022-02-27 NOTE — Progress Notes (Signed)
Post Partum Day 1 Subjective: up ad lib, voiding, tolerating PO, and Dizziness resolved after 2 Units PRBCs  Objective: Blood pressure (!) 99/53, pulse 66, temperature 98 F (36.7 C), temperature source Oral, resp. rate 18, height 5\' 7"  (1.702 m), weight 97.8 kg, last menstrual period 06/14/2021, SpO2 100 %, unknown if currently breastfeeding.  Patient Vitals for the past 24 hrs:  BP Temp Temp src Pulse Resp SpO2  02/27/22 2000 (!) 99/53 98 F (36.7 C) Oral 66 18 100 %  02/27/22 1700 113/60 97.7 F (36.5 C) Oral 65 16 99 %  02/27/22 1442 (!) 123/52 98.1 F (36.7 C) Oral 72 16 100 %  02/27/22 1427 (!) 105/47 98.1 F (36.7 C) Oral 74 14 --  02/27/22 1235 (!) 100/49 98 F (36.7 C) Oral 75 16 100 %  02/27/22 1015 (!) 83/39 97.7 F (36.5 C) Oral (!) 59 16 100 %  02/27/22 1001 (!) 79/39 97.7 F (36.5 C) Oral 68 16 100 %  02/27/22 0530 (!) 100/54 98.2 F (36.8 C) Oral 73 18 99 %  02/27/22 0130 (!) 100/52 98.3 F (36.8 C) Oral 75 18 99 %  02/26/22 2130 (!) 99/59 98.7 F (37.1 C) Oral 82 18 99 %     Physical Exam:  General: alert, cooperative, no distress, moderately obese, and not cyanotic Lochia: appropriate. Has lighten significantly Uterine Fundus: firm Incision: NA DVT Evaluation: No evidence of DVT seen on physical exam.  Recent Labs    02/26/22 1744 02/27/22 0542  HGB 8.2* 5.5*  HCT 25.3* 16.9*    Assessment/Plan: Plan for discharge tomorrow, Lactation consult, and Contraception Depo Check CBC at 2200. Discussed importance of following up w/ Hematologist after delivery.    LOS: 1 day   [place], [place] 02/27/2022, 8:56 PM

## 2022-02-27 NOTE — Lactation Note (Addendum)
This note was copied from a baby's chart. Lactation Consultation Note  Patient Name: Boy Philamena Kramar GNFAO'Z Date: 02/27/2022   Age:19 hours Attempted to see mom. Mom awake stated baby has all ready fed.  Mom was going to be getting blood tonight.  Maternal Data    Feeding Nipple Type: Nfant Slow Flow (purple)  LATCH Score                    Lactation Tools Discussed/Used    Interventions    Discharge    Consult Status      Theodoro Kalata 02/27/2022, 11:50 PM

## 2022-02-27 NOTE — Progress Notes (Signed)
CRITICAL VALUE STICKER  CRITICAL VALUE: Hgb 6.9  RECEIVER (on-site recipient of call): Serena Croissant, LPN  DATE & TIME NOTIFIED: 02/27/22 2300  MESSENGER (representative from lab):  MD NOTIFIED: Agapito Games  TIME OF NOTIFICATION: 2332  RESPONSE:  no new orders, continue plan of care

## 2022-02-27 NOTE — Lactation Note (Signed)
This note was copied from a baby's chart. Lactation Consultation Note  Patient Name: Brenda Ramirez TJQZE'S Date: 02/27/2022 Reason for consult: Follow-up assessment;Primapara;1st time breastfeeding;Late-preterm 34-36.6wks;Infant weight loss;Breastfeeding assistance (0.98% WL) Age:19 hours  P1, Late-preterm, Infant Female, 0.98% WL  LC entered the room and the infant was being held by the supporting parent.  Per the birth parent, breastfeeding is not going well due to the infant not being able to latch.  She stated that she has been pre-pumping to evert her nipple, but it does not stay hard.  She also said that she has tried hand expressing a drop of milk to entice the infant to latch, but she has not been successful.  The birth parent expressed that she would like breastfeeding assistance around 2000 or 2100 tonight.  LC encouraged the birth parent to call for latch assistance when she is ready to feed if someone from lactation is not able to see her during those times.   Maternal Data    Feeding Mother's Current Feeding Choice: Breast Milk and Formula Nipple Type: Nfant Slow Flow (purple)  LATCH Score                    Lactation Tools Discussed/Used    Interventions    Discharge    Consult Status Date: 02/28/22 Follow-up type: In-patient    Lysbeth Penner 02/27/2022, 6:31 PM

## 2022-02-28 LAB — CULTURE, BETA STREP (GROUP B ONLY)

## 2022-02-28 LAB — CBC
HCT: 24.4 % — ABNORMAL LOW (ref 36.0–46.0)
Hemoglobin: 7.9 g/dL — ABNORMAL LOW (ref 12.0–15.0)
MCH: 28.5 pg (ref 26.0–34.0)
MCHC: 32.4 g/dL (ref 30.0–36.0)
MCV: 88.1 fL (ref 80.0–100.0)
Platelets: 187 10*3/uL (ref 150–400)
RBC: 2.77 MIL/uL — ABNORMAL LOW (ref 3.87–5.11)
RDW: 14.3 % (ref 11.5–15.5)
WBC: 13.2 10*3/uL — ABNORMAL HIGH (ref 4.0–10.5)
nRBC: 0 % (ref 0.0–0.2)

## 2022-02-28 LAB — PREPARE RBC (CROSSMATCH)

## 2022-02-28 MED ORDER — IBUPROFEN 600 MG PO TABS: 600.0000 mg | ORAL_TABLET | Freq: Four times a day (QID) | ORAL | 0 refills | Status: DC

## 2022-02-28 MED ORDER — SODIUM CHLORIDE 0.9% IV SOLUTION: Freq: Once | INTRAVENOUS | Status: AC

## 2022-02-28 NOTE — Lactation Note (Signed)
This note was copied from a baby's chart. Lactation Consultation Note  Patient Name: Brenda Ramirez [id number] Date: 02/28/2022 Reason for consult: Follow-up assessment;1st time breastfeeding;Primapara;Late-preterm 34-36.6wks;Other (Comment) (Sleeve Gastrectomy surgery) Age:19 hours  LC in to visit with 57 yr old P14 Mom of LPTI.  Baby is at a 4.5% weight loss and having adequate output. Bilirubin level rising, baby looks jaundiced.  Normal sleepiness with jaundice reviewed  Mom states she is in a lot of pain.  RN notified.  Baby recently fed 30 ml of 22 cal Neosure and was sleeping in crib.  When asked about pumping, Mom shared that she only did it once and wasn't getting suction.  Offered to assist.  LC made a hand's free pumping top and assisted Mom to double pump.    Mom does not have a pump at home and states she would like to pump and breastfeed.  Mom has [place].  [facility] referral faxed.  Plan recommended- 1- Keep baby STS as much as possible 2- At least every 3 hrs, baby should be feeding     -breastfeed for 15 mins max as long as baby is actively feeding     -Pace bottle feed EBM+/formula per volume guidelines, Ped requested minimum of 21 ml today. 3-Pump both breasts 15 mins on initiation setting adding breast massage and hand expression 4-Good lactation F/U after discharge  Lactation Tools Discussed/Used Tools: Pump;Flanges;Bottle;Hands-free pumping top Flange Size: 24 Breast pump type: Double-Electric Breast Pump;Manual Pump Education: Setup, frequency, and cleaning;Milk Storage Reason for Pumping: Support milk supply/LPTI Pumping frequency: Encouraged pumping every 2- 3 hrs during the day and 3-4 hrs at night  Interventions Interventions: Breast feeding basics reviewed;Skin to skin;Breast massage;Hand express;Hand pump;DEBP  Discharge WIC Program: Yes  Consult Status Consult Status: Follow-up Date: 03/01/22 Follow-up type: In-patient    Broadus John 02/28/2022, 11:11 AM

## 2022-03-01 LAB — TYPE AND SCREEN
ABO/RH(D): A POS
Antibody Screen: NEGATIVE
Unit division: 0
Unit division: 0
Unit division: 0

## 2022-03-01 LAB — BPAM RBC
Blood Product Expiration Date: 202310122359
Blood Product Expiration Date: 202310122359
Blood Product Expiration Date: 202310132359
ISSUE DATE / TIME: 202309240955
ISSUE DATE / TIME: 202309241420
ISSUE DATE / TIME: 202309250134
Unit Type and Rh: 6200
Unit Type and Rh: 6200
Unit Type and Rh: 6200

## 2022-03-03 ENCOUNTER — Encounter: Payer: Medicaid Other | Admitting: Obstetrics and Gynecology

## 2022-03-03 LAB — VON WILLEBRAND PANEL
Factor VIII Activity: 226 % — ABNORMAL HIGH (ref 56–140)
Von Willebrand Ag: 266 % — ABNORMAL HIGH (ref 50–200)
Von Willebrand Factor: 182 % (ref 50–200)

## 2022-03-03 LAB — CBC
Hematocrit: 28.2 % — ABNORMAL LOW (ref 34.0–46.6)
Hemoglobin: 9 g/dL — ABNORMAL LOW (ref 11.1–15.9)
MCH: 27.9 pg (ref 26.6–33.0)
MCHC: 31.9 g/dL (ref 31.5–35.7)
MCV: 87 fL (ref 79–97)
Platelets: 265 10*3/uL (ref 150–450)
RBC: 3.23 x10E6/uL — ABNORMAL LOW (ref 3.77–5.28)
RDW: 13.1 % (ref 11.7–15.4)
WBC: 9.3 10*3/uL (ref 3.4–10.8)

## 2022-03-03 LAB — FERRITIN: Ferritin: 5 ng/mL — ABNORMAL LOW (ref 15–77)

## 2022-03-03 LAB — GLUCOSE TOLERANCE, 1 HOUR: Glucose, 1Hr PP: 91 mg/dL (ref 70–199)

## 2022-03-03 LAB — COAG STUDIES INTERP REPORT

## 2022-03-03 LAB — PTH, INTACT AND CALCIUM
Calcium: 8.8 mg/dL (ref 8.7–10.2)
PTH: 22 pg/mL (ref 15–65)

## 2022-03-03 LAB — PT AND PTT
INR: 0.9 (ref 0.9–1.2)
Prothrombin Time: 10 s (ref 9.1–12.0)
aPTT: 24 s (ref 24–33)

## 2022-03-03 LAB — VITAMIN D 1,25 DIHYDROXY
Vitamin D 1, 25 (OH)2 Total: 134 pg/mL — ABNORMAL HIGH
Vitamin D2 1, 25 (OH)2: <10 pg/mL
Vitamin D3 1, 25 (OH)2: 129 pg/mL

## 2022-03-03 LAB — FOLATE: Folate: 11 ng/mL (ref >3.0)

## 2022-03-04 ENCOUNTER — Other Ambulatory Visit: Payer: Medicaid Other

## 2022-03-04 ENCOUNTER — Encounter: Payer: Medicaid Other | Admitting: Family

## 2022-03-04 ENCOUNTER — Other Ambulatory Visit: Payer: Self-pay | Admitting: Family

## 2022-03-04 DIAGNOSIS — D6800 Von Willebrand disease, unspecified: Secondary | ICD-10-CM

## 2022-03-07 ENCOUNTER — Encounter: Payer: Self-pay | Admitting: Family

## 2022-03-07 ENCOUNTER — Inpatient Hospital Stay (HOSPITAL_BASED_OUTPATIENT_CLINIC_OR_DEPARTMENT_OTHER): Payer: Medicaid Other | Admitting: Family

## 2022-03-07 ENCOUNTER — Other Ambulatory Visit: Payer: Self-pay

## 2022-03-07 ENCOUNTER — Inpatient Hospital Stay: Payer: Medicaid Other | Attending: Hematology & Oncology

## 2022-03-07 ENCOUNTER — Telehealth (HOSPITAL_COMMUNITY): Payer: Self-pay | Admitting: *Deleted

## 2022-03-07 VITALS — BP 102/58 | HR 84 | Temp 98.6°F | Resp 18 | Ht 66.93 in | Wt 219.0 lb

## 2022-03-07 DIAGNOSIS — Z803 Family history of malignant neoplasm of breast: Secondary | ICD-10-CM

## 2022-03-07 DIAGNOSIS — Z79899 Other long term (current) drug therapy: Secondary | ICD-10-CM | POA: Insufficient documentation

## 2022-03-07 DIAGNOSIS — D6800 Von Willebrand disease, unspecified: Secondary | ICD-10-CM | POA: Diagnosis present

## 2022-03-07 DIAGNOSIS — R002 Palpitations: Secondary | ICD-10-CM | POA: Insufficient documentation

## 2022-03-07 DIAGNOSIS — D649 Anemia, unspecified: Secondary | ICD-10-CM

## 2022-03-07 LAB — CMP (CANCER CENTER ONLY)
ALT: 14 U/L (ref 0–44)
AST: 16 U/L (ref 15–41)
Albumin: 3.9 g/dL (ref 3.5–5.0)
Alkaline Phosphatase: 81 U/L (ref 38–126)
Anion gap: 9 (ref 5–15)
BUN: 6 mg/dL (ref 6–20)
CO2: 23 mmol/L (ref 22–32)
Calcium: 9.5 mg/dL (ref 8.9–10.3)
Chloride: 107 mmol/L (ref 98–111)
Creatinine: 0.65 mg/dL (ref 0.44–1.00)
GFR, Estimated: >60 mL/min (ref >60)
Glucose, Bld: 95 mg/dL (ref 70–99)
Potassium: 3.6 mmol/L (ref 3.5–5.1)
Sodium: 139 mmol/L (ref 135–145)
Total Bilirubin: 0.5 mg/dL (ref 0.3–1.2)
Total Protein: 8.1 g/dL (ref 6.5–8.1)

## 2022-03-07 LAB — CBC WITH DIFFERENTIAL (CANCER CENTER ONLY)
Abs Immature Granulocytes: 0.07 10*3/uL (ref 0.00–0.07)
Basophils Absolute: 0.1 10*3/uL (ref 0.0–0.1)
Basophils Relative: 1 %
Eosinophils Absolute: 0.1 10*3/uL (ref 0.0–0.5)
Eosinophils Relative: 2 %
HCT: 34.3 % — ABNORMAL LOW (ref 36.0–46.0)
Hemoglobin: 11 g/dL — ABNORMAL LOW (ref 12.0–15.0)
Immature Granulocytes: 1 %
Lymphocytes Relative: 28 %
Lymphs Abs: 2.4 10*3/uL (ref 0.7–4.0)
MCH: 28.8 pg (ref 26.0–34.0)
MCHC: 32.1 g/dL (ref 30.0–36.0)
MCV: 89.8 fL (ref 80.0–100.0)
Monocytes Absolute: 0.5 10*3/uL (ref 0.1–1.0)
Monocytes Relative: 6 %
Neutro Abs: 5.6 10*3/uL (ref 1.7–7.7)
Neutrophils Relative %: 62 %
Platelet Count: 336 10*3/uL (ref 150–400)
RBC: 3.82 MIL/uL — ABNORMAL LOW (ref 3.87–5.11)
RDW: 15.4 % (ref 11.5–15.5)
WBC Count: 8.7 10*3/uL (ref 4.0–10.5)
nRBC: 0 % (ref 0.0–0.2)

## 2022-03-07 LAB — APTT: aPTT: 29 seconds (ref 24–36)

## 2022-03-07 LAB — PROTIME-INR
INR: 1 (ref 0.8–1.2)
Prothrombin Time: 13.5 seconds (ref 11.4–15.2)

## 2022-03-07 NOTE — Telephone Encounter (Signed)
Mom reports feeling good. No concerns about herself at this time. EPDS=0 [facility] score=1) Mom reports baby is doing well. Feeding, peeing, and pooping without difficulty. Safe sleep reviewed. Mom reports no concerns about baby at present.  Odis Hollingshead, RN 03-07-2022 at 12;03pm

## 2022-03-07 NOTE — Progress Notes (Signed)
Hematology/Oncology Consultation   Name: Brenda Ramirez      MRN: [id number]    Location: Room/bed info not found  Date: 03/07/2022 Time:3:21 PM   REFERRING PHYSICIAN: Manya Silvas, CNM  REASON FOR CONSULT:  Von Willebrand disease, 3rd trimester   DIAGNOSIS: VWB panel pending, 3rd trimester  HISTORY OF PRESENT ILLNESS:  [name] is a very pleasant 21 yo female with history of VWB disease diagnosed in 2018. We are awaiting her records from hematologist in [place].  She delivered her sweet baby boy Brenda Ramirez at [redacted]w[redacted]d on 02/27/2022. She was treated with DDAVP, JADA and required 2 units of blood due to heavy blood loss after delivery. She states that she is only passing clots at this time. No new blood loss.  No abnormal bruising or petechiae.  She states that her cycle is regular but her flow is generally heavy.  No known familial history of VWB.  She states that in 2018 she had her gallbladder removed and does not remember being treated prior to or after for the VWB disease. She denies any issues with bleeding with procedure.  PTT is 29, PT 13.5 and INR 1.0.  Hgb is improved from 7.9 at hospital discharge to 11.0, MCV 89, platelets 336 and WBC count 8.7.  She is symptomatic with fatigue, weakness, occasional palpitations, dizziness and intermittent numbness and tingling in her arms.  No history of diabetes or thyroid disease.  No personal history of cancer. Her paternal grandmother has breast cancer.  Appetite has been down since deliver. She still has generalized abdominal pain.  She is doing her best to stay well hydrated. Her weight is 219 lbs.  No fever, chills, n/v, cough, rash, chest pain or changes in bowel or bladder habits.  No swelling or tenderness in her extremities.  No falls or syncope reported.  No smoking, ETOH or recreational drug use.  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 She has been pumping and bottle feeding her baby.   ROS: All other 10 point review of  systems is negative.   PAST MEDICAL HISTORY:   Past Medical History:  Diagnosis Date   Anxiety    Asthma    Depression    Von Willebrand disease ([place])    Tested for in 2018    ALLERGIES: Allergies  Allergen Reactions   Pineapple Anaphylaxis, Itching and Swelling    Swelling of jaws and mouth      MEDICATIONS:  Current Outpatient Medications on File Prior to Visit  Medication Sig Dispense Refill   ibuprofen (ADVIL) 600 MG tablet Take 1 tablet (600 mg total) by mouth every 6 (six) hours. 30 tablet 0   Prenatal Vit-Fe Fumarate-FA (PRENATAL VITAMIN) 27-0.8 MG TABS Take 1 tablet by mouth daily. 30 tablet 8   albuterol (VENTOLIN HFA) 108 (90 Base) MCG/ACT inhaler Inhale 2 puffs into the lungs every 6 (six) hours as needed for wheezing or shortness of breath. (Patient not taking: Reported on 09/22/2021) 8 g 2   busPIRone (BUSPAR) 7.5 MG tablet Take 1 tablet (7.5 mg total) by mouth 3 (three) times daily. (Patient not taking: Reported on 09/22/2021) 90 tablet 2   cyclobenzaprine (FLEXERIL) 5 MG tablet Take 1 tablet (5 mg total) by mouth 3 (three) times daily as needed for muscle spasms. (Patient not taking: Reported on 12/15/2021) 20 tablet 0   ferrous sulfate (FERROUSUL) 325 (65 FE) MG tablet Take 1 tablet (325 mg total) by mouth 2 (two) times daily. (Patient not taking: Reported on  11/03/2021) 60 tablet 1   vitamin B-12 (CYANOCOBALAMIN) 100 MCG tablet Take 1 tablet (100 mcg total) by mouth daily. (Patient not taking: Reported on 09/22/2021) 90 tablet 3   Vitamin D, Ergocalciferol, (DRISDOL) 1.25 MG (50000 UNIT) CAPS capsule Take 1 capsule (50,000 Units total) by mouth every 7 (seven) days. (Patient not taking: Reported on 12/15/2021) 4 capsule 4   No current facility-administered medications on file prior to visit.     PAST SURGICAL HISTORY Past Surgical History:  Procedure Laterality Date   BARIATRIC SURGERY  02/05/2020    FAMILY HISTORY: Family History  Problem Relation Age of Onset    Diabetes Father    Asthma Sister    Asthma Sister     SOCIAL HISTORY:  reports that she has never smoked. She has never used smokeless tobacco. She reports that she does not drink alcohol and does not use drugs.  PERFORMANCE STATUS: The patient's performance status is 1 - Symptomatic but completely ambulatory  PHYSICAL EXAM: Most Recent Vital Signs: Last menstrual period 06/14/2021, unknown if currently breastfeeding. BP (!) 102/58 (BP Location: Right Arm, Patient Position: Sitting)   Pulse 84   Temp 98.6 F (37 C) (Oral)   Resp 18   Ht 5' 6.93" (1.7 m)   Wt 219 lb (99.3 kg)   LMP 06/14/2021   SpO2 100%   BMI 34.37 kg/m   General Appearance:    Alert, cooperative, no distress, appears stated age  Head:    Normocephalic, without obvious abnormality, atraumatic  Eyes:    PERRL, conjunctiva/corneas clear, EOM's intact, fundi    benign, both eyes  Ears:    Normal TM's and external ear canals, both ears  Nose:   Nares normal, septum midline, mucosa normal, no drainage    or sinus tenderness  Throat:   Lips, mucosa, and tongue normal; teeth and gums normal  Neck:   Supple, symmetrical, trachea midline, no adenopathy;    thyroid:  no enlargement/tenderness/nodules; no carotid   bruit or JVD  Back:     Symmetric, no curvature, ROM normal, no CVA tenderness  Lungs:     Clear to auscultation bilaterally, respirations unlabored  Chest Wall:    No tenderness or deformity   Heart:    Regular rate and rhythm, S1 and S2 normal, no murmur, rub   or gallop  Breast Exam:    No tenderness, masses, or nipple abnormality  Abdomen:     Soft, non-tender, bowel sounds active all four quadrants,    no masses, no organomegaly  Genitalia:    Normal female without lesion, discharge or tenderness  Rectal:    Normal tone, normal prostate, no masses or tenderness;   guaiac negative stool  Extremities:   Extremities normal, atraumatic, no cyanosis or edema  Pulses:   2+ and symmetric all extremities   Skin:   Skin color, texture, turgor normal, no rashes or lesions  Lymph nodes:   Cervical, supraclavicular, and axillary nodes normal  Neurologic:   CNII-XII intact, normal strength, sensation and reflexes    throughout    LABORATORY DATA:  Results for orders placed or performed in visit on 03/07/22 (from the past 48 hour(s))  CBC with Differential (Cancer Center Only)     Status: Abnormal   Collection Time: 03/07/22  2:59 PM  Result Value Ref Range   WBC Count 8.7 4.0 - 10.5 K/uL   RBC 3.82 (L) 3.87 - 5.11 MIL/uL   Hemoglobin 11.0 (L) 12.0 - 15.0 g/dL  HCT 34.3 (L) 36.0 - 46.0 %   MCV 89.8 80.0 - 100.0 fL   MCH 28.8 26.0 - 34.0 pg   MCHC 32.1 30.0 - 36.0 g/dL   RDW 15.4 11.5 - 15.5 %   Platelet Count 336 150 - 400 K/uL   nRBC 0.0 0.0 - 0.2 %   Neutrophils Relative % 62 %   Neutro Abs 5.6 1.7 - 7.7 K/uL   Lymphocytes Relative 28 %   Lymphs Abs 2.4 0.7 - 4.0 K/uL   Monocytes Relative 6 %   Monocytes Absolute 0.5 0.1 - 1.0 K/uL   Eosinophils Relative 2 %   Eosinophils Absolute 0.1 0.0 - 0.5 K/uL   Basophils Relative 1 %   Basophils Absolute 0.1 0.0 - 0.1 K/uL   Immature Granulocytes 1 %   Abs Immature Granulocytes 0.07 0.00 - 0.07 K/uL    Comment: Performed at [facility] at [facility], [address] [place], [place] [address]      RADIOGRAPHY: No results found.     PATHOLOGY: None   ASSESSMENT/PLAN: [name] is a very pleasant 21 yo female with history of VWB disease diagnosed in 2018.  VWB panel pending. We will see what her counts look like.  We were unable to add on iron studies to her labs today. She will continue taking her prenatal vitamin which contains iron.  Follow-up pending lab results. We will certainly plan to follow-up as needed prior to surgery and if she becomes pregnant again in the future.   All questions were answered. The patient knows to call the clinic with any problems, questions or concerns. We can  certainly see the patient much sooner if necessary.  The patient was discussed with Dr. Marin Olp and he is in agreement with the aforementioned.   Lottie Dawson, NP

## 2022-03-09 LAB — VON WILLEBRAND PANEL
Coagulation Factor VIII: 164 % — ABNORMAL HIGH (ref 56–140)
Ristocetin Co-factor, Plasma: 99 % (ref 50–200)
Von Willebrand Antigen, Plasma: 145 % (ref 50–200)

## 2022-03-09 LAB — COAG STUDIES INTERP REPORT

## 2022-03-14 ENCOUNTER — Encounter (HOSPITAL_BASED_OUTPATIENT_CLINIC_OR_DEPARTMENT_OTHER): Payer: Self-pay | Admitting: Pediatrics

## 2022-03-14 ENCOUNTER — Other Ambulatory Visit: Payer: Self-pay

## 2022-03-14 ENCOUNTER — Emergency Department (HOSPITAL_COMMUNITY): Payer: Medicaid Other

## 2022-03-14 ENCOUNTER — Emergency Department (HOSPITAL_BASED_OUTPATIENT_CLINIC_OR_DEPARTMENT_OTHER)
Admission: EM | Admit: 2022-03-14 | Discharge: 2022-03-15 | Disposition: A | Discharge status: Home / Self Care | Payer: Medicaid Other | Attending: Emergency Medicine | Admitting: Emergency Medicine

## 2022-03-14 DIAGNOSIS — D649 Anemia, unspecified: Secondary | ICD-10-CM | POA: Diagnosis not present

## 2022-03-14 DIAGNOSIS — R200 Anesthesia of skin: Secondary | ICD-10-CM | POA: Diagnosis not present

## 2022-03-14 DIAGNOSIS — F444 Conversion disorder with motor symptom or deficit: Secondary | ICD-10-CM | POA: Diagnosis not present

## 2022-03-14 DIAGNOSIS — R531 Weakness: Secondary | ICD-10-CM | POA: Insufficient documentation

## 2022-03-14 DIAGNOSIS — R202 Paresthesia of skin: Secondary | ICD-10-CM | POA: Diagnosis not present

## 2022-03-14 DIAGNOSIS — J45909 Unspecified asthma, uncomplicated: Secondary | ICD-10-CM | POA: Insufficient documentation

## 2022-03-14 DIAGNOSIS — D6800 Von Willebrand disease, unspecified: Secondary | ICD-10-CM | POA: Diagnosis not present

## 2022-03-14 LAB — BASIC METABOLIC PANEL
Anion gap: 8 (ref 5–15)
BUN: 8 mg/dL (ref 6–20)
CO2: 21 mmol/L — ABNORMAL LOW (ref 22–32)
Calcium: 9 mg/dL (ref 8.9–10.3)
Chloride: 110 mmol/L (ref 98–111)
Creatinine, Ser: 0.64 mg/dL (ref 0.44–1.00)
GFR, Estimated: >60 mL/min (ref >60)
Glucose, Bld: 90 mg/dL (ref 70–99)
Potassium: 3.9 mmol/L (ref 3.5–5.1)
Sodium: 139 mmol/L (ref 135–145)

## 2022-03-14 LAB — CBC WITH DIFFERENTIAL/PLATELET
Abs Immature Granulocytes: 0.01 10*3/uL (ref 0.00–0.07)
Basophils Absolute: 0.1 10*3/uL (ref 0.0–0.1)
Basophils Relative: 1 %
Eosinophils Absolute: 0.1 10*3/uL (ref 0.0–0.5)
Eosinophils Relative: 2 %
HCT: 35.4 % — ABNORMAL LOW (ref 36.0–46.0)
Hemoglobin: 11.4 g/dL — ABNORMAL LOW (ref 12.0–15.0)
Immature Granulocytes: 0 %
Lymphocytes Relative: 54 %
Lymphs Abs: 3.2 10*3/uL (ref 0.7–4.0)
MCH: 28.5 pg (ref 26.0–34.0)
MCHC: 32.2 g/dL (ref 30.0–36.0)
MCV: 88.5 fL (ref 80.0–100.0)
Monocytes Absolute: 0.4 10*3/uL (ref 0.1–1.0)
Monocytes Relative: 7 %
Neutro Abs: 2.2 10*3/uL (ref 1.7–7.7)
Neutrophils Relative %: 36 %
Platelets: 393 10*3/uL (ref 150–400)
RBC: 4 MIL/uL (ref 3.87–5.11)
RDW: 15.2 % (ref 11.5–15.5)
WBC: 6 10*3/uL (ref 4.0–10.5)
nRBC: 0 % (ref 0.0–0.2)

## 2022-03-14 LAB — MAGNESIUM: Magnesium: 1.9 mg/dL (ref 1.7–2.4)

## 2022-03-14 MED ORDER — GADOBUTROL 1 MMOL/ML IV SOLN
8.0000 mL | Freq: Once | INTRAVENOUS | Status: AC | PRN
  Administered 2022-03-14: 8 mL via INTRAVENOUS

## 2022-03-14 NOTE — ED Notes (Signed)
Pt is in MRI  

## 2022-03-14 NOTE — ED Notes (Addendum)
Error

## 2022-03-14 NOTE — ED Triage Notes (Signed)
Reported paresthesia on both arms the day after giving birth, naturally without any anesthesia and felt this the day after, but today this sensation are much worse; reported she received 3 blood transfusions. Reported hx of Von Willebrand dx. Stated takes MVI and ibuprofen. Denies chest pain and shortness of breath, endorsed some dizziness.

## 2022-03-14 NOTE — ED Provider Notes (Addendum)
Care assumed from previous provider at shift change.  See note for full HPI.  In summation 21 year old history of von Willebrand's disease, 2 weeks postpartum s/p uncomplicated VSD without epidural here for evaluation of 2 weeks of progressive bilateral upper extremity numbness and weakness.  Since she is not able to move her bilateral lower extremities.  Does have some pain however no obvious infectious process.  Labs personally viewed and interpreted  Plan from previous provider was discussed with neurology, suspect patient likely needs MRI and transfer  Discussed with Dr. Lorrin Goodell with Neuro. Recommends transfer to [facility] for MR brain and cervical spine with and without contrast.  MR Brain/C-Spine ordered   NEURO would like to see patient while in ED after MR results. Please call to let them know when patient arrives for evaluation   Discussed with Dr. Darl Householder  EDP at [facility]. Agrees to accept patient in transfer.   Discussed plan with patient, significant other in room.  They are agreeable for transfer.  They do not want EMS transport.  Patient prefers a POV.  I discussed risk versus benefit. Patient to go POV to [place] for evaluation and management.  No current respiratory involvement.  Stable vital signs.  EMTALA filled out.  Patient can go with IV in place. No hx of IVDU       Stefani Baik A, PA-C 17/35/67 0141    Lianne Cure, DO 08/04/29 2225

## 2022-03-14 NOTE — ED Notes (Signed)
Patient report given to [facility] at [facility] ER.

## 2022-03-14 NOTE — ED Provider Notes (Signed)
[facility] EMERGENCY DEPARTMENT Provider Note   CSN: [id number] Arrival date & time: 03/14/22  1657     History {Add pertinent medical, surgical, social history, OB history to HPI:1} Chief Complaint  Patient presents with  . Numbness    Brenda Ramirez is a 21 y.o. female who presents emergency department with chief complaint of bilateral upper extremity weakness numbness and paresthesia.  Patient is status post vaginal delivery of a 35-week 4-day pregnancy.  She has a history of von Willebrand's disease.  She did not undergo epidural.  Patient states that she began having significant pain and paresthesia in both of her fingertips, pain worse on the left side which she thought was due to a vein that was accessed in her right index finger.  She states that she asked to be checked during her visit however she feels that she was not.  She was discharged on the 25th after delivering on the 23rd.  She states that since that time she has had progressively worsening pain and swelling and a sensation of itching in her upper extremities.  Pain is worse on the left side and worse when she is lightly brushed on the hand.  She is now unable to utilize her upper extremities at all.  She is unable to move her fingers.  The sensation of her arms being asleep goes from her fingertips up to her shoulders.  She is unable to hold her infant, breast feed or dress herself.  She denies fevers chills, saddle anesthesia, lower extremity weakness.  She is ambulatory.  She has no facial weakness or complaints.  She denies headaches, neck pain, nausea, vomiting, changes in vision.  HPI     Home Medications Prior to Admission medications   Medication Sig Start Date End Date Taking? Authorizing Provider  albuterol (VENTOLIN HFA) 108 (90 Base) MCG/ACT inhaler Inhale 2 puffs into the lungs every 6 (six) hours as needed for wheezing or shortness of breath. Patient not taking: Reported on  09/22/2021 08/30/21   Reva Bores, MD  busPIRone (BUSPAR) 7.5 MG tablet Take 1 tablet (7.5 mg total) by mouth 3 (three) times daily. Patient not taking: Reported on 09/22/2021 08/30/21   Reva Bores, MD  cyclobenzaprine (FLEXERIL) 5 MG tablet Take 1 tablet (5 mg total) by mouth 3 (three) times daily as needed for muscle spasms. Patient not taking: Reported on 12/15/2021 12/13/21   Bernerd Limbo, CNM  ferrous sulfate (FERROUSUL) 325 (65 FE) MG tablet Take 1 tablet (325 mg total) by mouth 2 (two) times daily. Patient not taking: Reported on 11/03/2021 08/31/21   Reva Bores, MD  ibuprofen (ADVIL) 600 MG tablet Take 1 tablet (600 mg total) by mouth every 6 (six) hours. 02/28/22   Raelyn Mora, CNM  Prenatal Vit-Fe Fumarate-FA (PRENATAL VITAMIN) 27-0.8 MG TABS Take 1 tablet by mouth daily. 08/30/21   Reva Bores, MD  vitamin B-12 (CYANOCOBALAMIN) 100 MCG tablet Take 1 tablet (100 mcg total) by mouth daily. Patient not taking: Reported on 09/22/2021 08/31/21   Reva Bores, MD  Vitamin D, Ergocalciferol, (DRISDOL) 1.25 MG (50000 UNIT) CAPS capsule Take 1 capsule (50,000 Units total) by mouth every 7 (seven) days. Patient not taking: Reported on 12/15/2021 11/15/21   Brand Males, CNM      Allergies    Pineapple    Review of Systems   Review of Systems  Physical Exam Updated Vital Signs BP 108/79 (BP Location: Left Arm)   Pulse  95   Temp (!) 97.4 F (36.3 C) (Oral)   Resp 20   Ht 5\' 6"  (1.676 m)   Wt 84.8 kg   LMP 06/14/2021   SpO2 98%   BMI 30.18 kg/m  Physical Exam Vitals and nursing note reviewed.  Constitutional:      General: She is not in acute distress.    Appearance: She is well-developed. She is not diaphoretic.  HENT:     Head: Normocephalic and atraumatic.     Right Ear: External ear normal.     Left Ear: External ear normal.     Nose: Nose normal.     Mouth/Throat:     Mouth: Mucous membranes are moist.  Eyes:     General: No scleral icterus.     Conjunctiva/sclera: Conjunctivae normal.  Cardiovascular:     Rate and Rhythm: Normal rate and regular rhythm.     Heart sounds: Normal heart sounds. No murmur heard.    No friction rub. No gallop.  Pulmonary:     Effort: Pulmonary effort is normal. No respiratory distress.     Breath sounds: Normal breath sounds.  Abdominal:     General: Bowel sounds are normal. There is no distension.     Palpations: Abdomen is soft. There is no mass.     Tenderness: There is no abdominal tenderness. There is no guarding.  Musculoskeletal:     Cervical back: Normal range of motion.  Skin:    General: Skin is warm and dry.  Neurological:     Mental Status: She is alert and oriented to person, place, and time.     Comments: Bilateral upper extremities are essentially paralyzed.  She is able to barely wiggle the index and middle finger on her right arm.  She has a strong pulses bilateral.  She has hyperesthesia and pain when palpating or brushing the skin on the left arm.  She is unable to move her arms at all using her shoulders all the way to her fingers.  She is able to shrug her shoulders up toward her ears.  She appears to muted but present upper extremity reflexes.  Normal strength and sensation of the lower extremities.  Psychiatric:        Behavior: Behavior normal.    ED Results / Procedures / Treatments   Labs (all labs ordered are listed, but only abnormal results are displayed) Labs Reviewed  CBC WITH DIFFERENTIAL/PLATELET - Abnormal; Notable for the following components:      Result Value   Hemoglobin 11.4 (*)    HCT 35.4 (*)    All other components within normal limits  BASIC METABOLIC PANEL - Abnormal; Notable for the following components:   CO2 21 (*)    All other components within normal limits  MAGNESIUM    EKG None  Radiology No results found.  Procedures Procedures  {Document cardiac monitor, telemetry assessment procedure when appropriate:1}  Medications Ordered  in ED Medications - No data to display  ED Course/ Medical Decision Making/ A&P                           Medical Decision Making  ***  {Document critical care time when appropriate:1} {Document review of labs and clinical decision tools ie heart score, Chads2Vasc2 etc:1}  {Document your independent review of radiology images, and any outside records:1} {Document your discussion with family members, caretakers, and with consultants:1} {Document social determinants of health affecting pt's  care:1} {Document your decision making why or why not admission, treatments were needed:1} Final Clinical Impression(s) / ED Diagnoses Final diagnoses:  None    Rx / DC Orders ED Discharge Orders     None

## 2022-03-15 DIAGNOSIS — F444 Conversion disorder with motor symptom or deficit: Secondary | ICD-10-CM

## 2022-03-15 DIAGNOSIS — R531 Weakness: Secondary | ICD-10-CM

## 2022-03-15 DIAGNOSIS — R200 Anesthesia of skin: Secondary | ICD-10-CM | POA: Diagnosis not present

## 2022-03-15 NOTE — Consult Note (Signed)
NEUROLOGY CONSULTATION NOTE   Date of service: March 15, 2022 Patient Name: Brenda Ramirez MRN:  [id number] DOB:  [birth year] Reason for consult: "BL upper ext weakness and numbness" Requesting Provider: No att. providers found _ _ _   _ __   _ __ _ _  __ __   _ __   __ _  History of Present Illness  Brenda Ramirez is a 21 y.o. female with PMH significant for anxiety, asthma, depression, VWF disease who presents with bilateral upper extremity numbness, tingling and weakness.  She reports that she gave birth about 2 weeks ago when she noted that her IV site was painful and itchy.  That was the first time that she noticed paresthesias in her forearm.  She reports that over the last 2 weeks, she has had numbness and tingling progressed up from her hands into her elbow and of her shoulders bilaterally.  She reports that despite the numbness, she was able to move her hands without any weakness.  She reports that she went to bed and woke up and now has trouble moving both of her upper extremities.  She denies any viral illness recently, no gastroenteritis-like symptoms, no URI-like symptoms.  No prior history of similar symptoms.  She eats a good mix of meat, vegetables and dairy.  She reports that her delivery was uneventful.  It was a spontaneous vaginal delivery and she did not get epidural.  She initially presented to med [facility] where she was only able to move her fingers but now feels like her motor strength is improved and she can move more.  She denies any neck pain, no headache, no history of multiple sclerosis.   ROS   Constitutional Denies weight loss, fever and chills.   HEENT Denies changes in vision and hearing.   Respiratory Denies SOB and cough.   CV Denies palpitations and CP   GI Denies abdominal pain, nausea, vomiting and diarrhea.   GU Denies dysuria and urinary frequency.   MSK Denies myalgia and joint pain.   Skin Denies  rash and pruritus.   Neurological Denies headache and syncope.   Psychiatric Denies recent changes in mood. Denies anxiety and depression.    Past History   Past Medical History:  Diagnosis Date   Anxiety    Asthma    Depression    Von Willebrand disease (HCC)    Tested for in 2018   Past Surgical History:  Procedure Laterality Date   BARIATRIC SURGERY  02/05/2020   Family History  Problem Relation Age of Onset   Diabetes Father    Asthma Sister    Asthma Sister    Social History   Socioeconomic History   Marital status: Single    Spouse name: Not on file   Number of children: Not on file   Years of education: Not on file   Highest education level: Not on file  Occupational History   Not on file  Tobacco Use   Smoking status: Never   Smokeless tobacco: Never  Vaping Use   Vaping Use: Never used  Substance and Sexual Activity   Alcohol use: Never   Drug use: Never   Sexual activity: Yes    Birth control/protection: None  Other Topics Concern   Not on file  Social History Narrative   Not on file   Social Determinants of Health   Financial Resource Strain: Not on file  Food Insecurity: No Food Insecurity (02/27/2022)  Hunger Vital Sign    Worried About Running Out of Food in the Last Year: Never true    Ran Out of Food in the Last Year: Never true  Transportation Needs: No Transportation Needs (02/27/2022)   PRAPARE - Hydrologist (Medical): No    Lack of Transportation (Non-Medical): No  Physical Activity: Not on file  Stress: Not on file  Social Connections: Not on file   Allergies  Allergen Reactions   Pineapple Anaphylaxis, Itching and Swelling    Swelling of jaws and mouth    Medications  (Not in a hospital admission)    Vitals   Vitals:   03/14/22 1815 03/14/22 2220 03/15/22 0057 03/15/22 0117  BP: 116/76 109/76 116/72 116/75  Pulse: 68 89 61 64  Resp: 14 16 (!) 24 20  Temp:  98.3 F (36.8 C) 98.2 F (36.8  C) 98.2 F (36.8 C)  TempSrc:  Oral Oral Oral  SpO2: 100% 99% 100% 100%  Weight:      Height:         Body mass index is 30.18 kg/m.  Physical Exam   General: Laying comfortably in bed; in no acute distress.  HENT: Normal oropharynx and mucosa. Normal external appearance of ears and nose.  Neck: Supple, no pain or tenderness  CV: No JVD. No peripheral edema.  Pulmonary: Symmetric Chest rise. Normal respiratory effort.  Abdomen: Soft to touch, non-tender.  Ext: No cyanosis, edema, or deformity  Skin: No rash. Normal palpation of skin.   Musculoskeletal: Normal digits and nails by inspection. No clubbing.   Neurologic Examination  Mental status/Cognition: Alert, oriented to self, place, month and year, good attention.  Speech/language: Fluent, comprehension intact, object naming intact, repetition intact.  Cranial nerves:   CN II Pupils equal and reactive to light, no VF deficits    CN III,IV,VI EOM intact, no gaze preference or deviation, no nystagmus    CN V normal sensation in V1, V2, and V3 segments bilaterally    CN VII no asymmetry, no nasolabial fold flattening    CN VIII normal hearing to speech    CN IX & X normal palatal elevation, no uvular deviation    CN XI 5/5 head turn and 5/5 shoulder shrug bilaterally    CN XII midline tongue protrusion    Motor:  Muscle bulk: normal, tone normal. Mvmt Root Nerve  Muscle Right Left Comments  SA C5/6 Ax Deltoid 4+ 4+   EF C5/6 Mc Biceps 4+ 4+   EE C6/7/8 Rad Triceps 4+ 4+   WF C6/7 Med FCR     WE C7/8 PIN ECU     F Ab C8/T1 U ADM/FDI 4 4 On motor exam, her strength continues to improve gradually.  HF L1/2/3 Fem Illopsoas 5 5   KE L2/3/4 Fem Quad 5 5   DF L4/5 D Peron Tib Ant 5 5   PF S1/2 Tibial Grc/Sol 5 5    Reflexes:  Right Left Comments  Pectoralis      Biceps (C5/6) 2 2   Brachioradialis (C5/6) 2 2    Triceps (C6/7) 2 2    Patellar (L3/4) 2 2    Achilles (S1) 2 2    Hoffman      Plantar     Jaw jerk     Sensation:  Light touch Hyperesthetic in left anterior forearm, otherwise intact throughout.   Pin prick Intact throughout   Temperature    Vibration Intact in  all extremities.  Proprioception    Coordination/Complex Motor:  - Finger to Nose intact bilaterally although slow - Heel to shin intact bilaterally - Rapid alternating movement are slowed - Gait: Stride length normal. Arm swing poor. Base width narrow  Labs   CBC:  Recent Labs  Lab 03/14/22 1719  WBC 6.0  NEUTROABS 2.2  HGB 11.4*  HCT 35.4*  MCV 88.5  PLT 393    Basic Metabolic Panel:  Lab Results  Component Value Date   NA 139 03/14/2022   K 3.9 03/14/2022   CO2 21 (L) 03/14/2022   GLUCOSE 90 03/14/2022   BUN 8 03/14/2022   CREATININE 0.64 03/14/2022   CALCIUM 9.0 03/14/2022   GFRNONAA >60 03/14/2022   Lipid Panel: No results found for: "LDLCALC" HgbA1c:  Lab Results  Component Value Date   HGBA1C 4.9 08/19/2021   Urine Drug Screen: No results found for: "LABOPIA", "COCAINSCRNUR", "LABBENZ", "AMPHETMU", "THCU", "LABBARB"  Alcohol Level No results found for: "ETH"  MRI Brain(Personally reviewed): Normal MRI of the brain and cervical spine.  MRI cervical spine with and without contrast(personally reviewed): Normal MRI of the brain and cervical spine.  Impression   Wandalee Klang is a 21 y.o. female with PMH significant for anxiety, asthma, depression, VWF disease who presents with progressive bilateral upper extremity numbness, tingling over 2 weeks and bilateral upper extremity weakness x1 day.  Her neuro exam is very effort dependent.  Notable, her motor strength when testing all muscle groups individually, will gradually increase from barely any movement to a 4+ out of 5 over the course of 5 to 10 seconds.  There is also a component of improvement in the motor strength with distraction.  Overall, high suspicion for a potential functional etiology to her weakness.  I did  consider GBS especially given the symmetric nature of her sensory and motor deficit.  However, she has preserved reflexes, no involvement of the cranial nerves or pharyngeal muscles. Exam fluctuates and improves with time and thus not consistent with true motor weakness. She also reports improvement in her arms spontaneously which again would be unlikely this quick if this truly was GBS.  Also spoke to patient about the concern for underlying functional etiology of her presentation. She endorses significant stress from her housing situation, the fact that her family is planning to move to [place] and she will lose much of her social support overnight and stressed from being a new mother at a young age.  Recommendations  - recommend PT and OT - discussed with patient and brought up that she should avoid holding the baby until her strength improves. - Can follow up with neurology outpatient for further evaluation and to see if she requires an EMG outpatient. - no further inpatient neurological workup at this time. ______________________________________________________________________   Thank you for the opportunity to take part in the care of this patient. If you have any further questions, please contact the neurology consultation attending.  Signed,  Erick Blinks [facility] Pager Number [phone] _ _ _   _ __   _ __ _ _  __ __   _ __   __ _

## 2022-03-15 NOTE — ED Notes (Signed)
neuro Provider at bedside.

## 2022-03-15 NOTE — ED Notes (Signed)
ED Provider at bedside. 

## 2022-03-15 NOTE — ED Provider Notes (Signed)
Received from Arthor Captain, PA-C from med center as a transfer please see her note for full detail  In short patient with medical history including von Willebrand's disease, asthma, status post uncomplicated vaginal pregnancy presents  with complaints of bilateral arm numbness and weakness.  Patient states that this she has had it pins and needle sensation in her fingers but then this advanced up into her arms bilaterally, states that she is unable to really move them, she never has had this in the past, she has no associate headaches change in vision paresthesia or weakness in the lower extremities, no recent head trauma, not on anticoag, denies any neck pain no fevers no chills no history of IV drug use she has no other complaints.   Physical Exam  BP 116/75   Pulse 64   Temp 98.2 F (36.8 C) (Oral)   Resp 20   Ht 5\' 6"  (1.676 m)   Wt 84.8 kg   LMP 06/14/2021   SpO2 100%   BMI 30.18 kg/m   Physical Exam Vitals and nursing note reviewed.  Constitutional:      General: She is not in acute distress.    Appearance: She is not ill-appearing.  HENT:     Head: Normocephalic and atraumatic.     Nose: No congestion.  Eyes:     Conjunctiva/sclera: Conjunctivae normal.  Cardiovascular:     Rate and Rhythm: Normal rate and regular rhythm.     Pulses: Normal pulses.  Pulmonary:     Effort: Pulmonary effort is normal.  Skin:    General: Skin is warm and dry.  Neurological:     Mental Status: She is alert.     Comments: No facial asymmetry no difficulty with word finding, following two-step commands, she has noted weakness in her upper extremities bilaterally, she is able to slightly move her fingers and bend at her wrists but is unable to move at her elbows or shoulders, she has full sensation in her fingers wrist elbows and shoulders all compartments are soft 2+ radial pulses.  She has full strength sensation intact in lower extremities she is ambulate that difficulty.  Psychiatric:         Mood and Affect: Mood normal.     Procedures  Procedures  ED Course / MDM    Medical Decision Making Amount and/or Complexity of Data Reviewed Labs: ordered. Radiology: ordered.  Risk Prescription drug management.    Lab Tests:  I Ordered, and personally interpreted labs.  The pertinent results include: CBC shows normocytic anemia hemoglobin 11.4, BMP shows CO2 of 21 magnesium 1.9   Imaging Studies ordered:  I ordered imaging studies including MRI brain and C-spine I independently visualized and interpreted imaging which showed negative acute findings I agree with the radiologist interpretation   Cardiac Monitoring:  The patient was maintained on a cardiac monitor.  I personally viewed and interpreted the cardiac monitored which showed an underlying rhythm of: N/A   Medicines ordered and prescription drug management:  I ordered medication including N/A I have reviewed the patients home medicines and have made adjustments as needed  Critical Interventions:  N/a   Reevaluation:  Presents from med Center, MRI was ordered, I reviewed imaging which was negative, will speak with neurology for further recommendations  Spoke with the patient in regards to staying for PT/OT eval the patient states that she would like to go home and have outpatient follow-up, this will be arranged she is agreement plan discharge at this  time.  Consultations Obtained:  I requested consultation with the neurologist Dr. Lorrin Goodell,  and discussed lab and imaging findings as well as pertinent plan - they recommend: He has evaluated the patient and feels this is psychogenic, her symptoms have improved since coming to the ER, he does not recommend admission at this time, he does recommend outpatient neurology follow-up, as well as PT OT eval possible this in the ER or at home.    Test Considered:  N/A    Rule out  Low suspicion for CVA MRI is negative for acute findings.   Low  suspicion for Guillain-Barr  as presentation is atypical of etiology.  I have low suspicion for spinal cord abscess she is nontoxic-appearing vital signs are reassuring no evidence of infection present my exam.  I doubt MS is there is no evidence of abdomen seen on MRI.  Low suspicion for spinal cord stenosis again MRI is negative.  low suspicion for low suspicion for dissection of the vertebral or carotid artery as presentation atypical of etiology.  Low suspicion for meningitis as she has no meningeal sign present.     Dispostion and problem list  After consideration of the diagnostic results and the patients response to treatment, I feel that the patent would benefit from discharge.  Paresthesias/weakness-likely psychogenic, will send home with home PT OT eval, follow-up with neurology, strict return precautions.           Marcello Fennel, PA-C 03/15/22 1941    Merryl Hacker, MD 03/15/22 [phone]

## 2022-03-15 NOTE — Discharge Instructions (Signed)
You were evaluated by neurology, they recommend that you follow-up with them as an outpatient, I have sent in referral for them to call you.  It is also recommend that PT and OT come out and assess you, I have sent an internal referral, they should be contacting  you within the next 24 hours to schedule a follow-up appointment.  symptoms causes you to have weakness in your arm, I would not hold your child until your strength is fully back to normal it would have your partner assist you when taking care of your child.  Come back to the emergency department if you develop chest pain, shortness of breath, severe abdominal pain, uncontrolled nausea, vomiting, diarrhea.

## 2022-03-17 ENCOUNTER — Ambulatory Visit: Payer: Medicaid Other

## 2022-03-17 DIAGNOSIS — F419 Anxiety disorder, unspecified: Secondary | ICD-10-CM

## 2022-03-17 DIAGNOSIS — O0990 Supervision of high risk pregnancy, unspecified, unspecified trimester: Secondary | ICD-10-CM

## 2022-03-29 ENCOUNTER — Ambulatory Visit: Payer: Medicaid Other

## 2022-04-04 ENCOUNTER — Telehealth: Payer: Self-pay | Admitting: *Deleted

## 2022-04-04 NOTE — Telephone Encounter (Signed)
I called Brenda Ramirez and asked if she wanted to pick up her medical records that she has brought to the office for provider to review and she stated to shred them. She states she is not in [place] anymore and just to shred them. I informed her I will put in the shred bin. Of note records were reviewed by [place] and she selected the records she wanted scanned in and they were scanned in back in September. Staci Acosta

## 2022-04-06 ENCOUNTER — Telehealth: Payer: Medicaid Other | Admitting: Family Medicine

## 2022-04-06 DIAGNOSIS — Z91199 Patient's noncompliance with other medical treatment and regimen due to unspecified reason: Secondary | ICD-10-CM

## 2022-04-06 NOTE — Progress Notes (Signed)
Called pt and pt informed me that she will need to cancel her appt as she is in [place].  Pt states that she has a pp visit scheduled in [place].  Provider notified.    Neylan Koroma,RN  04/06/22

## 2022-09-09 ENCOUNTER — Inpatient Hospital Stay: Payer: Self-pay | Admitting: Oncology

## 2022-09-09 ENCOUNTER — Inpatient Hospital Stay: Payer: Self-pay | Attending: Hematology & Oncology

## 2022-09-09 NOTE — Progress Notes (Signed)
[facility] OFFICE PROGRESS NOTE  Pcp, No No address on file  DIAGNOSIS: ***  PRIOR THERAPY:  CURRENT THERAPY:  INTERVAL HISTORY: Brenda Ramirez 22 y.o. female returns for *** regular *** visit for followup of ***   MEDICAL HISTORY: Past Medical History:  Diagnosis Date   Anxiety    Asthma    Depression    Von Willebrand disease (HCC)    Tested for in 2018    ALLERGIES:  is allergic to pineapple.  MEDICATIONS:  Current Outpatient Medications  Medication Sig Dispense Refill   albuterol (VENTOLIN HFA) 108 (90 Base) MCG/ACT inhaler Inhale 2 puffs into the lungs every 6 (six) hours as needed for wheezing or shortness of breath. (Patient not taking: Reported on 09/22/2021) 8 g 2   busPIRone (BUSPAR) 7.5 MG tablet Take 1 tablet (7.5 mg total) by mouth 3 (three) times daily. (Patient not taking: Reported on 09/22/2021) 90 tablet 2   cyclobenzaprine (FLEXERIL) 5 MG tablet Take 1 tablet (5 mg total) by mouth 3 (three) times daily as needed for muscle spasms. (Patient not taking: Reported on 12/15/2021) 20 tablet 0   ferrous sulfate (FERROUSUL) 325 (65 FE) MG tablet Take 1 tablet (325 mg total) by mouth 2 (two) times daily. (Patient not taking: Reported on 11/03/2021) 60 tablet 1   ibuprofen (ADVIL) 600 MG tablet Take 1 tablet (600 mg total) by mouth every 6 (six) hours. 30 tablet 0   Prenatal Vit-Fe Fumarate-FA (PRENATAL VITAMIN) 27-0.8 MG TABS Take 1 tablet by mouth daily. 30 tablet 8   vitamin B-12 (CYANOCOBALAMIN) 100 MCG tablet Take 1 tablet (100 mcg total) by mouth daily. (Patient not taking: Reported on 09/22/2021) 90 tablet 3   Vitamin D, Ergocalciferol, (DRISDOL) 1.25 MG (50000 UNIT) CAPS capsule Take 1 capsule (50,000 Units total) by mouth every 7 (seven) days. (Patient not taking: Reported on 12/15/2021) 4 capsule 4   No current facility-administered medications for this visit.    SURGICAL HISTORY:  Past Surgical History:  Procedure Laterality  Date   BARIATRIC SURGERY  02/05/2020    REVIEW OF SYSTEMS:   Review of Systems  Constitutional: Negative for appetite change, chills, fatigue, fever and unexpected weight change.  HENT:   Negative for mouth sores, nosebleeds, sore throat and trouble swallowing.   Eyes: Negative for eye problems and icterus.  Respiratory: Negative for cough, hemoptysis, shortness of breath and wheezing.   Cardiovascular: Negative for chest pain and leg swelling.  Gastrointestinal: Negative for abdominal pain, constipation, diarrhea, nausea and vomiting.  Genitourinary: Negative for bladder incontinence, difficulty urinating, dysuria, frequency and hematuria.   Musculoskeletal: Negative for back pain, gait problem, neck pain and neck stiffness.  Skin: Negative for itching and rash.  Neurological: Negative for dizziness, extremity weakness, gait problem, headaches, light-headedness and seizures.  Hematological: Negative for adenopathy. Does not bruise/bleed easily.  Psychiatric/Behavioral: Negative for confusion, depression and sleep disturbance. The patient is not nervous/anxious.     PHYSICAL EXAMINATION:  currently breastfeeding.  ECOG PERFORMANCE STATUS: {CHL ONC ECOG [id number]:[phone]}  Physical Exam  Constitutional: Oriented to person, place, and time and well-developed, well-nourished, and in no distress. No distress.  HENT:  Head: Normocephalic and atraumatic.  Mouth/Throat: Oropharynx is clear and moist. No oropharyngeal exudate.  Eyes: Conjunctivae are normal. Right eye exhibits no discharge. Left eye exhibits no discharge. No scleral icterus.  Neck: Normal range of motion. Neck supple.  Cardiovascular: Normal rate, regular rhythm, normal heart sounds and intact distal pulses.   Pulmonary/Chest: Effort  normal and breath sounds normal. No respiratory distress. No wheezes. No rales.  Abdominal: Soft. Bowel sounds are normal. Exhibits no distension and no mass. There is no tenderness.   Musculoskeletal: Normal range of motion. Exhibits no edema.  Lymphadenopathy:    No cervical adenopathy.  Neurological: Alert and oriented to person, place, and time. Exhibits normal muscle tone. Gait normal. Coordination normal.  Skin: Skin is warm and dry. No rash noted. Not diaphoretic. No erythema. No pallor.  Psychiatric: Mood, memory and judgment normal.  Vitals reviewed.  LABORATORY DATA: Lab Results  Component Value Date   WBC 6.0 03/14/2022   HGB 11.4 (L) 03/14/2022   HCT 35.4 (L) 03/14/2022   MCV 88.5 03/14/2022   PLT 393 03/14/2022      Chemistry      Component Value Date/Time   NA 139 03/14/2022 1719   K 3.9 03/14/2022 1719   CL 110 03/14/2022 1719   CO2 21 (L) 03/14/2022 1719   BUN 8 03/14/2022 1719   CREATININE 0.64 03/14/2022 1719   CREATININE 0.65 03/07/2022 1459      Component Value Date/Time   CALCIUM 9.0 03/14/2022 1719   ALKPHOS 81 03/07/2022 1459   AST 16 03/07/2022 1459   ALT 14 03/07/2022 1459   BILITOT 0.5 03/07/2022 1459       RADIOGRAPHIC STUDIES:  No results found.   ASSESSMENT/PLAN:  No problem-specific Assessment & Plan notes found for this encounter.   Orders Placed This Encounter  Procedures   CBC with Differential (Cancer Center Only)    Standing Status:   Future    Standing Expiration Date:   09/09/2023   CMP (Cancer Center only)    Standing Status:   Future    Standing Expiration Date:   09/09/2023   Ferritin    Standing Status:   Future    Standing Expiration Date:   09/09/2023   Iron and Iron Binding Capacity ([facility] only)    Standing Status:   Future    Standing Expiration Date:   09/09/2023   Von Willebrand panel    Standing Status:   Future    Standing Expiration Date:   09/09/2023     Clenton Pare, DNP, AGPCNP-BC, AOCNP 09/09/22

## 2022-09-13 ENCOUNTER — Encounter: Payer: Self-pay | Admitting: Family

## 2023-02-09 ENCOUNTER — Emergency Department (HOSPITAL_BASED_OUTPATIENT_CLINIC_OR_DEPARTMENT_OTHER)
Admission: EM | Admit: 2023-02-09 | Discharge: 2023-02-10 | Disposition: A | Discharge status: Home / Self Care | Payer: Self-pay | Attending: Emergency Medicine | Admitting: Emergency Medicine

## 2023-02-09 ENCOUNTER — Other Ambulatory Visit: Payer: Self-pay

## 2023-02-09 ENCOUNTER — Emergency Department (HOSPITAL_BASED_OUTPATIENT_CLINIC_OR_DEPARTMENT_OTHER): Payer: Self-pay

## 2023-02-09 DIAGNOSIS — N939 Abnormal uterine and vaginal bleeding, unspecified: Secondary | ICD-10-CM | POA: Insufficient documentation

## 2023-02-09 LAB — URINALYSIS, ROUTINE W REFLEX MICROSCOPIC
Glucose, UA: NEGATIVE mg/dL
Ketones, ur: NEGATIVE mg/dL
Leukocytes,Ua: NEGATIVE
Nitrite: NEGATIVE
Protein, ur: 30 mg/dL — AB
Specific Gravity, Urine: >=1.03 (ref 1.005–1.030)
pH: 5.5 (ref 5.0–8.0)

## 2023-02-09 LAB — URINALYSIS, MICROSCOPIC (REFLEX)

## 2023-02-09 LAB — BASIC METABOLIC PANEL
Anion gap: 6 (ref 5–15)
BUN: 6 mg/dL (ref 6–20)
CO2: 24 mmol/L (ref 22–32)
Calcium: 9 mg/dL (ref 8.9–10.3)
Chloride: 107 mmol/L (ref 98–111)
Creatinine, Ser: 0.72 mg/dL (ref 0.44–1.00)
GFR, Estimated: >60 mL/min (ref >60)
Glucose, Bld: 66 mg/dL — ABNORMAL LOW (ref 70–99)
Potassium: 3.2 mmol/L — ABNORMAL LOW (ref 3.5–5.1)
Sodium: 137 mmol/L (ref 135–145)

## 2023-02-09 LAB — CBC WITH DIFFERENTIAL/PLATELET
Abs Immature Granulocytes: 0.02 10*3/uL (ref 0.00–0.07)
Basophils Absolute: 0.1 10*3/uL (ref 0.0–0.1)
Basophils Relative: 1 %
Eosinophils Absolute: 0.2 10*3/uL (ref 0.0–0.5)
Eosinophils Relative: 3 %
HCT: 38.8 % (ref 36.0–46.0)
Hemoglobin: 13 g/dL (ref 12.0–15.0)
Immature Granulocytes: 0 %
Lymphocytes Relative: 44 %
Lymphs Abs: 3.2 10*3/uL (ref 0.7–4.0)
MCH: 32.3 pg (ref 26.0–34.0)
MCHC: 33.5 g/dL (ref 30.0–36.0)
MCV: 96.3 fL (ref 80.0–100.0)
Monocytes Absolute: 0.5 10*3/uL (ref 0.1–1.0)
Monocytes Relative: 7 %
Neutro Abs: 3.3 10*3/uL (ref 1.7–7.7)
Neutrophils Relative %: 45 %
Platelets: 269 10*3/uL (ref 150–400)
RBC: 4.03 MIL/uL (ref 3.87–5.11)
RDW: 12.5 % (ref 11.5–15.5)
WBC: 7.2 10*3/uL (ref 4.0–10.5)
nRBC: 0 % (ref 0.0–0.2)

## 2023-02-09 LAB — PREGNANCY, URINE: Preg Test, Ur: NEGATIVE

## 2023-02-09 LAB — CBG MONITORING, ED: Glucose-Capillary: 86 mg/dL (ref 70–99)

## 2023-02-09 MED ORDER — GLUCOSE 40 % PO GEL
1.0000 | Freq: Once | ORAL | Status: AC
  Administered 2023-02-09: 31 g via ORAL
  Filled 2023-02-09: qty 1.21

## 2023-02-09 NOTE — ED Triage Notes (Signed)
Pt states she has been bleeding since she gave birth 9/23.  Heavy bleeding with cramping and passing lg clots Has been receiving IV iron transfusions US done in Jan of this year dx with ovarian cysts

## 2023-02-09 NOTE — ED Notes (Signed)
Pt. Is in US at present time. 

## 2023-02-09 NOTE — ED Notes (Signed)
Pt. Reports she has had lower abd. Pain and pain with her ovaries in the past.  Pt. In no distress.

## 2023-02-10 MED ORDER — MEGESTROL ACETATE 40 MG PO TABS: ORAL_TABLET | ORAL | 0 refills | Status: AC

## 2023-02-10 NOTE — ED Provider Notes (Signed)
[place] [facility] Provider Note   CSN: [id number] Arrival date & time: 02/09/23  1920     History {Add pertinent medical, surgical, social history, OB history to HPI:1} Chief Complaint  Patient presents with   Abdominal Pain    Brenda Ramirez is a 22 y.o. female.   Abdominal Pain      Home Medications Prior to Admission medications   Medication Sig Start Date End Date Taking? Authorizing Provider  albuterol (VENTOLIN HFA) 108 (90 Base) MCG/ACT inhaler Inhale 2 puffs into the lungs every 6 (six) hours as needed for wheezing or shortness of breath. Patient not taking: Reported on 09/22/2021 08/30/21   Reva Bores, MD  busPIRone (BUSPAR) 7.5 MG tablet Take 1 tablet (7.5 mg total) by mouth 3 (three) times daily. Patient not taking: Reported on 09/22/2021 08/30/21   Reva Bores, MD  cyclobenzaprine (FLEXERIL) 5 MG tablet Take 1 tablet (5 mg total) by mouth 3 (three) times daily as needed for muscle spasms. Patient not taking: Reported on 12/15/2021 12/13/21   Bernerd Limbo, CNM  ferrous sulfate (FERROUSUL) 325 (65 FE) MG tablet Take 1 tablet (325 mg total) by mouth 2 (two) times daily. Patient not taking: Reported on 11/03/2021 08/31/21   Reva Bores, MD  ibuprofen (ADVIL) 600 MG tablet Take 1 tablet (600 mg total) by mouth every 6 (six) hours. 02/28/22   Raelyn Mora, CNM  Prenatal Vit-Fe Fumarate-FA (PRENATAL VITAMIN) 27-0.8 MG TABS Take 1 tablet by mouth daily. 08/30/21   Reva Bores, MD  vitamin B-12 (CYANOCOBALAMIN) 100 MCG tablet Take 1 tablet (100 mcg total) by mouth daily. Patient not taking: Reported on 09/22/2021 08/31/21   Reva Bores, MD  Vitamin D, Ergocalciferol, (DRISDOL) 1.25 MG (50000 UNIT) CAPS capsule Take 1 capsule (50,000 Units total) by mouth every 7 (seven) days. Patient not taking: Reported on 12/15/2021 11/15/21   Brand Males, CNM      Allergies    Pineapple    Review of Systems    Review of Systems  Gastrointestinal:  Positive for abdominal pain.    Physical Exam Updated Vital Signs BP (!) 97/55 (BP Location: Left Arm)   Pulse 62   Temp 98 F (36.7 C) (Oral)   Resp 19   Ht 5\' 6"  (1.676 m)   Wt 93.4 kg   SpO2 100%   BMI 33.25 kg/m  Physical Exam  ED Results / Procedures / Treatments   Labs (all labs ordered are listed, but only abnormal results are displayed) Labs Reviewed  URINALYSIS, ROUTINE W REFLEX MICROSCOPIC - Abnormal; Notable for the following components:      Result Value   Hgb urine dipstick TRACE (*)    Bilirubin Urine SMALL (*)    Protein, ur 30 (*)    All other components within normal limits  BASIC METABOLIC PANEL - Abnormal; Notable for the following components:   Potassium 3.2 (*)    Glucose, Bld 66 (*)    All other components within normal limits  URINALYSIS, MICROSCOPIC (REFLEX) - Abnormal; Notable for the following components:   Bacteria, UA RARE (*)    All other components within normal limits  PREGNANCY, URINE  CBC WITH DIFFERENTIAL/PLATELET  CBG MONITORING, ED    EKG None  Radiology US PELVIC COMPLETE W TRANSVAGINAL AND TORSION R/O  Result Date: 02/09/2023 CLINICAL DATA:  Vaginal bleeding EXAM: TRANSABDOMINAL AND TRANSVAGINAL ULTRASOUND OF PELVIS DOPPLER ULTRASOUND OF OVARIES TECHNIQUE: Both transabdominal and  transvaginal ultrasound examinations of the pelvis were performed. Transabdominal technique was performed for global imaging of the pelvis including uterus, ovaries, adnexal regions, and pelvic cul-de-sac. It was necessary to proceed with endovaginal exam following the transabdominal exam to visualize the uterus endometrium ovaries. Color and duplex Doppler ultrasound was utilized to evaluate blood flow to the ovaries. COMPARISON:  None Available. FINDINGS: Uterus Measurements: 7.2 x 4.6 by 5.5 cm = volume: 97 mL. No fibroids or other mass visualized. Endometrium Thickness: 4.9 mm.  No focal abnormality visualized.  Right ovary Measurements: 3 x 2.5 x 3 cm = volume: 11.7 mL. Normal appearance/no adnexal mass. Left ovary Measurements: 3.9 x 2.5 x 2.3 cm = volume: 11.7 mL. Normal appearance/no adnexal mass. Pulsed Doppler evaluation of both ovaries demonstrates normal low-resistance arterial and venous waveforms. Other findings Trace free fluid IMPRESSION: Negative pelvic ultrasound. Electronically Signed   By: Jasmine Pang M.D.   On: 02/09/2023 22:26    Procedures Procedures  {Document cardiac monitor, telemetry assessment procedure when appropriate:1}  Medications Ordered in ED Medications  dextrose (GLUTOSE) oral gel 40% (31 g Oral Given 02/09/23 2228)    ED Course/ Medical Decision Making/ A&P   {   Click here for ABCD2, HEART and other calculatorsREFRESH Note before signing :1}                              Medical Decision Making Amount and/or Complexity of Data Reviewed Labs: ordered. Radiology: ordered.  Risk OTC drugs.   ***  {Document critical care time when appropriate:1} {Document review of labs and clinical decision tools ie heart score, Chads2Vasc2 etc:1}  {Document your independent review of radiology images, and any outside records:1} {Document your discussion with family members, caretakers, and with consultants:1} {Document social determinants of health affecting pt's care:1} {Document your decision making why or why not admission, treatments were needed:1} Final Clinical Impression(s) / ED Diagnoses Final diagnoses:  None    Rx / DC Orders ED Discharge Orders     None

## 2023-02-10 NOTE — Discharge Instructions (Addendum)
You were seen in the ER today for evaluation of your vaginal bleeding. Your ultrasound was unremarkable.  I have spoken with our gynecologist on-call and is recommended that I give you a medication called Megace.  You will take it twice a day for the first 10 days and then once a day after.  He would like for you to see your gynecologist within this timeframe.  Please make sure you call to schedule an appointment.  This may have seeded with your questionable von Willebrand's disease.  Please take as prescribed.  I have included more information on this into the discharge report.  Please review.  If you have any concerns with new or worsening symptoms, please return to the nearest emergency department for evaluation.  Contact a doctor if: The bleeding lasts more than one week. You feel dizzy at times. You feel like you may vomit (nausea). You vomit. You feel light-headed or weak. Your symptoms get worse. Get help right away if: You faint. You have to change pads every hour. You have pain in your belly. You have a fever or chills. You get sweaty or weak. You pass large blood clots from your vagina. These symptoms may be an emergency. Get help right away. Call your local emergency services (911 in the [place] Do not wait to see if the symptoms will go away. Do not drive yourself to the hospital.  Contact a doctor if: The bleeding lasts more than one week. You feel dizzy at times. You feel like you may vomit (nausea). You vomit. You feel light-headed or weak. Your symptoms get worse. Get help right away if: You faint. You have to change pads every hour. You have pain in your belly. You have a fever or chills. You get sweaty or weak. You pass large blood clots from your vagina. These symptoms may be an emergency. Get help right away. Call your local emergency services (911 in the [place] Do not wait to see if the symptoms will go away. Do not drive yourself to the hospital.

## 2023-03-25 ENCOUNTER — Other Ambulatory Visit: Payer: Self-pay

## 2023-03-25 ENCOUNTER — Emergency Department (HOSPITAL_BASED_OUTPATIENT_CLINIC_OR_DEPARTMENT_OTHER)
Admission: EM | Admit: 2023-03-25 | Discharge: 2023-03-25 | Disposition: A | Discharge status: Home / Self Care | Payer: Self-pay | Attending: Emergency Medicine | Admitting: Emergency Medicine

## 2023-03-25 ENCOUNTER — Encounter (HOSPITAL_BASED_OUTPATIENT_CLINIC_OR_DEPARTMENT_OTHER): Payer: Self-pay | Admitting: Emergency Medicine

## 2023-03-25 DIAGNOSIS — K05219 Aggressive periodontitis, localized, unspecified severity: Secondary | ICD-10-CM

## 2023-03-25 DIAGNOSIS — K047 Periapical abscess without sinus: Secondary | ICD-10-CM | POA: Insufficient documentation

## 2023-03-25 MED ORDER — AMOXICILLIN 500 MG PO CAPS: 500.0000 mg | ORAL_CAPSULE | Freq: Three times a day (TID) | ORAL | 0 refills | Status: DC

## 2023-03-25 NOTE — Discharge Instructions (Addendum)
You were seen in the emergency room for oral abscess.   Given that the area is already starting to drain, I will send you home with antibiotics and encourage you to follow-up with a dentist. You alternate Tylenol and ibuprofen for pain control.  You can also use Orajel topically on the area if you experience pain.  Please return to the emergency room with any new or worsening symptoms.

## 2023-03-25 NOTE — ED Provider Notes (Signed)
[place] [facility] Provider Note   CSN: [id number] Arrival date & time: 03/25/23  1742     History  Chief Complaint  Patient presents with   Mouth Lesions    Brenda Ramirez is a 22 y.o. female with past medical history of von Willebrand's disease and asthma reporting to the emergency room today with having 1 day of gum swelling and small area near her left lower cavity.  Patient has known cavity in this tooth and knows she needs to see a dentist to have cavity repaired/getting tooth pulled.  Patient has been unable to see a dentist.  Of note patient, patient has not no ensure that she is breast-feeding patient reports she is no longer breast-feeding.  Denies any associated symptoms including no sore throat no difficulty breathing or shortness of breath no chest pain no fevers or chills.   Mouth Lesions      Home Medications Prior to Admission medications   Medication Sig Start Date End Date Taking? Authorizing Provider  albuterol (VENTOLIN HFA) 108 (90 Base) MCG/ACT inhaler Inhale 2 puffs into the lungs every 6 (six) hours as needed for wheezing or shortness of breath. Patient not taking: Reported on 09/22/2021 08/30/21   Reva Bores, MD  busPIRone (BUSPAR) 7.5 MG tablet Take 1 tablet (7.5 mg total) by mouth 3 (three) times daily. Patient not taking: Reported on 09/22/2021 08/30/21   Reva Bores, MD  cyclobenzaprine (FLEXERIL) 5 MG tablet Take 1 tablet (5 mg total) by mouth 3 (three) times daily as needed for muscle spasms. Patient not taking: Reported on 12/15/2021 12/13/21   Bernerd Limbo, CNM  ferrous sulfate (FERROUSUL) 325 (65 FE) MG tablet Take 1 tablet (325 mg total) by mouth 2 (two) times daily. Patient not taking: Reported on 11/03/2021 08/31/21   Reva Bores, MD  ibuprofen (ADVIL) 600 MG tablet Take 1 tablet (600 mg total) by mouth every 6 (six) hours. 02/28/22   Raelyn Mora, CNM  Prenatal Vit-Fe  Fumarate-FA (PRENATAL VITAMIN) 27-0.8 MG TABS Take 1 tablet by mouth daily. 08/30/21   Reva Bores, MD  vitamin B-12 (CYANOCOBALAMIN) 100 MCG tablet Take 1 tablet (100 mcg total) by mouth daily. Patient not taking: Reported on 09/22/2021 08/31/21   Reva Bores, MD  Vitamin D, Ergocalciferol, (DRISDOL) 1.25 MG (50000 UNIT) CAPS capsule Take 1 capsule (50,000 Units total) by mouth every 7 (seven) days. Patient not taking: Reported on 12/15/2021 11/15/21   Brand Males, CNM      Allergies    Pineapple    Review of Systems   Review of Systems  HENT:  Positive for mouth sores.     Physical Exam Updated Vital Signs BP 103/65 (BP Location: Right Arm)   Pulse 89   Temp 98 F (36.7 C) (Oral)   Resp 16   Ht 5\' 6"  (1.676 m)   Wt 93.4 kg   SpO2 100%   BMI 33.23 kg/m  Physical Exam Vitals and nursing note reviewed.  Constitutional:      General: She is not in acute distress.    Appearance: She is not ill-appearing, toxic-appearing or diaphoretic.  HENT:     Head: Normocephalic and atraumatic.     Mouth/Throat:     Pharynx: No oropharyngeal exudate or posterior oropharyngeal erythema.     Comments: Patient has lower left tooth cavity, gum surrounding has area of swelling and very small area of fluctuance, appears to be small abscess  that is starting to drain.   Patient reports she has seen dentist for this and knows she needs to have the tooth pulled has been able to follow-up with dentist in the meantime.  Patient does not have any pain surrounding area.  Eyes:     General: No scleral icterus.    Conjunctiva/sclera: Conjunctivae normal.  Cardiovascular:     Rate and Rhythm: Normal rate and regular rhythm.     Pulses: Normal pulses.     Heart sounds: Normal heart sounds.  Pulmonary:     Effort: Pulmonary effort is normal. No respiratory distress.     Breath sounds: Normal breath sounds.  Abdominal:     General: Abdomen is flat. Bowel sounds are normal.     Palpations:  Abdomen is soft.     Tenderness: There is no abdominal tenderness.  Musculoskeletal:     Cervical back: No rigidity or tenderness.  Lymphadenopathy:     Cervical: No cervical adenopathy.  Skin:    General: Skin is warm and dry.     Capillary Refill: Capillary refill takes less than 2 seconds.     Findings: No lesion.  Neurological:     General: No focal deficit present.     Mental Status: She is alert and oriented to person, place, and time. Mental status is at baseline.     ED Results / Procedures / Treatments   Labs (all labs ordered are listed, but only abnormal results are displayed) Labs Reviewed - No data to display  EKG None  Radiology No results found.  Procedures Procedures    Medications Ordered in ED Medications - No data to display  ED Course/ Medical Decision Making/ A&P                                 Medical Decision Making Risk Prescription drug management.   This patient presents to the ED for concern of left lower tooth pain, this involves an extensive number of treatment options, and is a complaint that carries with it a high risk of complications and morbidity.  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  On physical exam it appears the patient has very small abscess and gum swelling next to tooth, abscess has already started to drain.  Patient has no sign of Ludwig's angina, no shortness of breath or difficulty breathing.  Given  that the abscess is small and already draining I do not feel patient would benefit from any procedure in the emergency room.  Will send patient home with antibiotics and discussed need for follow-up with dentist.  Patient agrees to plan all questions answered.  Patient stable for discharge.  I have reviewed the patients home medicines and have made adjustments as needed   Plan  F/u w/ Dentist in 2-3d to ensure resolution of sx.  Sent Amoxicillin  Patient was given return precautions. Patient stable for discharge  at this time.  Patient educated on sx/dx and verbalized understanding of plan. Return to ER w/ new or worsening sx.          Final Clinical Impression(s) / ED Diagnoses Final diagnoses:  None    Rx / DC Orders ED Discharge Orders     None         Smitty Knudsen, PA-C 03/25/23 2220    Glyn Ade, MD 03/26/23 1502

## 2023-03-25 NOTE — ED Triage Notes (Signed)
Pt reports she she noticed a bump in her mouth "with yellow stuff in it" x 1 hr ago; denies pain

## 2023-05-05 ENCOUNTER — Other Ambulatory Visit: Payer: Self-pay

## 2023-05-05 ENCOUNTER — Encounter (HOSPITAL_BASED_OUTPATIENT_CLINIC_OR_DEPARTMENT_OTHER): Payer: Self-pay | Admitting: Emergency Medicine

## 2023-05-05 ENCOUNTER — Emergency Department (HOSPITAL_BASED_OUTPATIENT_CLINIC_OR_DEPARTMENT_OTHER)
Admission: EM | Admit: 2023-05-05 | Discharge: 2023-05-05 | Disposition: A | Discharge status: Home / Self Care | Payer: Self-pay | Attending: Emergency Medicine | Admitting: Emergency Medicine

## 2023-05-05 DIAGNOSIS — R519 Headache, unspecified: Secondary | ICD-10-CM | POA: Insufficient documentation

## 2023-05-05 DIAGNOSIS — R569 Unspecified convulsions: Secondary | ICD-10-CM | POA: Insufficient documentation

## 2023-05-05 LAB — CBC WITH DIFFERENTIAL/PLATELET
Abs Immature Granulocytes: 0.01 10*3/uL (ref 0.00–0.07)
Basophils Absolute: 0.1 10*3/uL (ref 0.0–0.1)
Basophils Relative: 1 %
Eosinophils Absolute: 0.2 10*3/uL (ref 0.0–0.5)
Eosinophils Relative: 3 %
HCT: 36.8 % (ref 36.0–46.0)
Hemoglobin: 12.2 g/dL (ref 12.0–15.0)
Immature Granulocytes: 0 %
Lymphocytes Relative: 40 %
Lymphs Abs: 2.3 10*3/uL (ref 0.7–4.0)
MCH: 32 pg (ref 26.0–34.0)
MCHC: 33.2 g/dL (ref 30.0–36.0)
MCV: 96.6 fL (ref 80.0–100.0)
Monocytes Absolute: 0.6 10*3/uL (ref 0.1–1.0)
Monocytes Relative: 10 %
Neutro Abs: 2.6 10*3/uL (ref 1.7–7.7)
Neutrophils Relative %: 46 %
Platelets: 223 10*3/uL (ref 150–400)
RBC: 3.81 MIL/uL — ABNORMAL LOW (ref 3.87–5.11)
RDW: 12.4 % (ref 11.5–15.5)
WBC: 5.7 10*3/uL (ref 4.0–10.5)
nRBC: 0 % (ref 0.0–0.2)

## 2023-05-05 LAB — PREGNANCY, URINE: Preg Test, Ur: NEGATIVE

## 2023-05-05 LAB — I-STAT CHEM 8, ED
BUN: 5 mg/dL — ABNORMAL LOW (ref 6–20)
Calcium, Ion: 1.2 mmol/L (ref 1.15–1.40)
Chloride: 105 mmol/L (ref 98–111)
Creatinine, Ser: 0.7 mg/dL (ref 0.44–1.00)
Glucose, Bld: 76 mg/dL (ref 70–99)
HCT: 38 % (ref 36.0–46.0)
Hemoglobin: 12.9 g/dL (ref 12.0–15.0)
Potassium: 3.7 mmol/L (ref 3.5–5.1)
Sodium: 141 mmol/L (ref 135–145)
TCO2: 23 mmol/L (ref 22–32)

## 2023-05-05 LAB — CBG MONITORING, ED: Glucose-Capillary: 100 mg/dL — ABNORMAL HIGH (ref 70–99)

## 2023-05-05 LAB — LACTIC ACID, PLASMA: Lactic Acid, Venous: 0.9 mmol/L (ref 0.5–1.9)

## 2023-05-05 MED ORDER — KETOROLAC TROMETHAMINE 30 MG/ML IJ SOLN
30.0000 mg | Freq: Once | INTRAMUSCULAR | Status: AC
  Administered 2023-05-05: 30 mg via INTRAVENOUS
  Filled 2023-05-05: qty 1

## 2023-05-05 MED ORDER — METOCLOPRAMIDE HCL 5 MG/ML IJ SOLN
10.0000 mg | Freq: Once | INTRAMUSCULAR | Status: AC
  Administered 2023-05-05: 10 mg via INTRAVENOUS
  Filled 2023-05-05: qty 2

## 2023-05-05 MED ORDER — SODIUM CHLORIDE 0.9 % IV BOLUS
1000.0000 mL | Freq: Once | INTRAVENOUS | Status: AC
  Administered 2023-05-05: 1000 mL via INTRAVENOUS

## 2023-05-05 NOTE — ED Triage Notes (Signed)
Pt POV reports being at work, friends at work report pt with seizure like symptoms (eyes rolling back, far off stare) today x2. Last occurred 1300 today.    Hx of seizures, does not take meds.

## 2023-05-05 NOTE — Discharge Instructions (Addendum)
You were seen in the Emergency Department after seizure-like activity Your pregnancy test was negative You do not have any more seizure episodes here Your headache improved after a "migraine cocktail" We offered you admission to have more testing to see if these are seizures causing the episodes.  He did not wish to stay in the hospital at this time You will need to follow-up with the number listed for neurology to have testing done as an outpatient Please call the number listed and schedule appointment as soon as they can get you in the office Outpatient seizure precautions: Per [facility] statutes, patients with seizures are not allowed to drive until they have been seizure-free for six months. Use caution when using heavy equipment or power tools. Avoid working on ladders or at heights. Take showers instead of baths. Ensure the water temperature is not too high on the home water heater. Do not go swimming alone. When caring for infants or small children, sit down when holding, feeding, or changing them to minimize risk of injury to the child in the event you have a seizure. Also, Maintain good sleep hygiene. Avoid alcohol.   Return to the emergency department for repeated episodes, severe headaches or any other concerns

## 2023-05-05 NOTE — ED Provider Notes (Signed)
[place] [facility] Provider Note   CSN: [id number] Arrival date & time: 05/05/23  1191     History  Chief Complaint  Patient presents with   Seizures    Brenda Ramirez is a 22 y.o. female.  With a history of seizures and migraine headaches who presents to the ED after suspected seizure.  Patient reports she was at work today when she experienced a prodrome of "intense focusing" and tunnel vision.  She then began to feel very lightheaded and coworkers told her that her "eyes rolled back in her head" she did not fully lose consciousness and was immediately at her neurologic baseline.  She was sent home from work and a similar episode happened again while at home.  Again no loss of consciousness or generalized convulsions.  She now has a frontal headache.  No changes in vision, fevers, chills, neck pain or focal weakness.  Patient tells me she does have a remote history of seizures but has never been on antiepileptics.  Last episode like this was a few months ago.  She has not followed by a neurologist.  She also has a history of migraine headaches.  No drug or alcohol use.   Seizures      Home Medications Prior to Admission medications   Medication Sig Start Date End Date Taking? Authorizing Provider  albuterol (VENTOLIN HFA) 108 (90 Base) MCG/ACT inhaler Inhale 2 puffs into the lungs every 6 (six) hours as needed for wheezing or shortness of breath. Patient not taking: Reported on 09/22/2021 08/30/21   Reva Bores, MD  amoxicillin (AMOXIL) 500 MG capsule Take 1 capsule (500 mg total) by mouth 3 (three) times daily. 03/25/23   Barrett, Horald Chestnut, PA-C  busPIRone (BUSPAR) 7.5 MG tablet Take 1 tablet (7.5 mg total) by mouth 3 (three) times daily. Patient not taking: Reported on 09/22/2021 08/30/21   Reva Bores, MD  cyclobenzaprine (FLEXERIL) 5 MG tablet Take 1 tablet (5 mg total) by mouth 3 (three) times daily as needed for muscle  spasms. Patient not taking: Reported on 12/15/2021 12/13/21   Bernerd Limbo, CNM  ferrous sulfate (FERROUSUL) 325 (65 FE) MG tablet Take 1 tablet (325 mg total) by mouth 2 (two) times daily. Patient not taking: Reported on 11/03/2021 08/31/21   Reva Bores, MD  ibuprofen (ADVIL) 600 MG tablet Take 1 tablet (600 mg total) by mouth every 6 (six) hours. 02/28/22   Raelyn Mora, CNM  Prenatal Vit-Fe Fumarate-FA (PRENATAL VITAMIN) 27-0.8 MG TABS Take 1 tablet by mouth daily. 08/30/21   Reva Bores, MD  vitamin B-12 (CYANOCOBALAMIN) 100 MCG tablet Take 1 tablet (100 mcg total) by mouth daily. Patient not taking: Reported on 09/22/2021 08/31/21   Reva Bores, MD  Vitamin D, Ergocalciferol, (DRISDOL) 1.25 MG (50000 UNIT) CAPS capsule Take 1 capsule (50,000 Units total) by mouth every 7 (seven) days. Patient not taking: Reported on 12/15/2021 11/15/21   Brand Males, CNM      Allergies    Pineapple    Review of Systems   Review of Systems  Neurological:  Positive for seizures.    Physical Exam Updated Vital Signs BP 106/66   Pulse 67   Temp 98.1 F (36.7 C)   Resp 16   Ht 5\' 6"  (1.676 m)   Wt 93 kg   SpO2 100%   BMI 33.09 kg/m  Physical Exam Vitals and nursing note reviewed.  HENT:  Head: Normocephalic and atraumatic.     Mouth/Throat:     Comments: No tongue biting Eyes:     Pupils: Pupils are equal, round, and reactive to light.  Cardiovascular:     Rate and Rhythm: Normal rate and regular rhythm.  Pulmonary:     Effort: Pulmonary effort is normal.     Breath sounds: Normal breath sounds.  Abdominal:     Palpations: Abdomen is soft.     Tenderness: There is no abdominal tenderness.  Musculoskeletal:     Cervical back: Neck supple. No rigidity.  Skin:    General: Skin is warm and dry.  Neurological:     General: No focal deficit present.     Mental Status: She is alert and oriented to person, place, and time.     Sensory: No sensory deficit.     Motor:  No weakness.     Coordination: Coordination normal.  Psychiatric:        Mood and Affect: Mood normal.     ED Results / Procedures / Treatments   Labs (all labs ordered are listed, but only abnormal results are displayed) Labs Reviewed  CBC WITH DIFFERENTIAL/PLATELET - Abnormal; Notable for the following components:      Result Value   RBC 3.81 (*)    All other components within normal limits  CBG MONITORING, ED - Abnormal; Notable for the following components:   Glucose-Capillary 100 (*)    All other components within normal limits  I-STAT CHEM 8, ED - Abnormal; Notable for the following components:   BUN 5 (*)    All other components within normal limits  PREGNANCY, URINE  LACTIC ACID, PLASMA  LACTIC ACID, PLASMA    EKG None  Radiology No results found.  Procedures Procedures    Medications Ordered in ED Medications  sodium chloride 0.9 % bolus 1,000 mL (0 mLs Intravenous Stopped 05/05/23 1835)  ketorolac (TORADOL) 30 MG/ML injection 30 mg (30 mg Intravenous Given 05/05/23 1659)  metoCLOPramide (REGLAN) injection 10 mg (10 mg Intravenous Given 05/05/23 1700)    ED Course/ Medical Decision Making/ A&P Clinical Course as of 05/05/23 1938  Fri May 05, 2023  1933 Pregnancy test negative.  No elevation in lactate or other significant abnormalities appreciated on CBC or metabolic panel.  Patient reports improvement in her headache after migraine cocktail.  Discussed her presentation today with Dr.Lindzen (neurology) who recommended inpatient mission for further seizure evaluation including EEG versus outpatient referral to neurology to have seizure workup done as outpatient.  Will give patient both these options and she wished for outpatient neurology referral which we will arrange for now.  Stable for discharge. [MP]    Clinical Course User Index [MP] Royanne Foots, DO                                 Medical Decision Making 22 year old female with history as  above presenting after 2 near syncopal episodes with "tunnel vision" and numbness in her tongue.  Subsequent headache now.  Afebrile and normotensive here without focal neurologic deficits on exam.  No evidence of lateral tongue biting.  Differential diagnosis for her presentation would include seizure, complex migraine headache, near syncopal episode and preeclampsia/eclampsia.  Will obtain laboratory workup including metabolic panel, CBC, lactate and pregnancy test  Will treat her headache with migraine cocktail of IV fluids Toradol and Reglan to see if this helps.  No recurrent  episodes or seizure-like activity at this point.  Will discuss with neurology prior to her disposition  Amount and/or Complexity of Data Reviewed Labs: ordered.  Risk Prescription drug management.           Final Clinical Impression(s) / ED Diagnoses Final diagnoses:  Seizure-like activity [facility])    Rx / DC Orders ED Discharge Orders     None         Royanne Foots, DO 05/05/23 1938

## 2023-06-06 IMAGING — US US OB COMP LESS 14 WK
1 series · 14 of 28 positions shown · non-contrast
Comparison: None.

CLINICAL DATA: Vaginal bleeding

EXAM:
OBSTETRIC <14 WK ULTRASOUND
TECHNIQUE: Transabdominal ultrasound was performed for evaluation of the
gestation as well as the maternal uterus and adnexal regions.

[Series 1: us ob comp less 14 wk · 45 acquisitions, 14 frames shown]
[im 2/45]
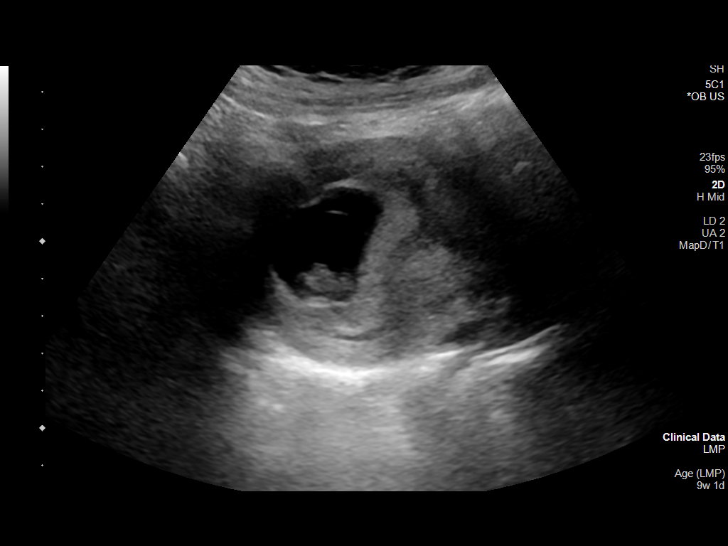
[im 5/45]
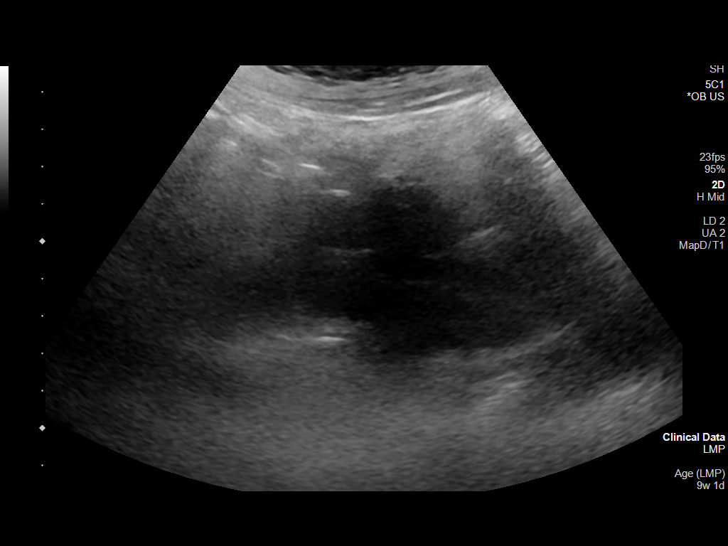
[im 9/45]
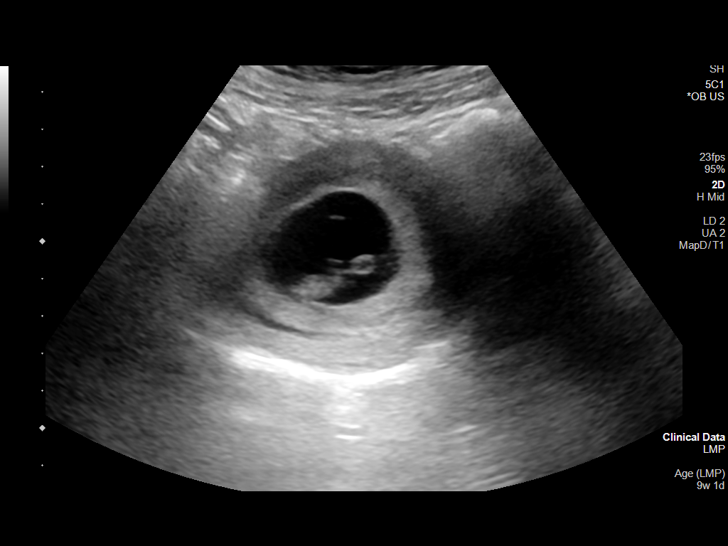
[im 12/45]
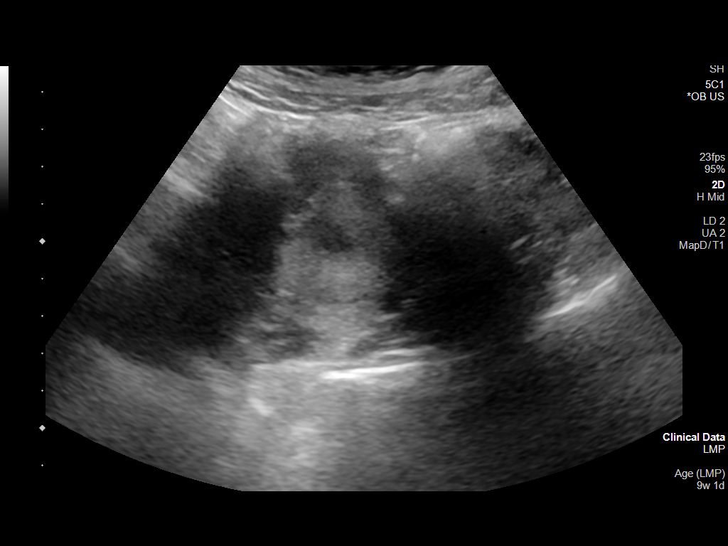
[im 15/45]
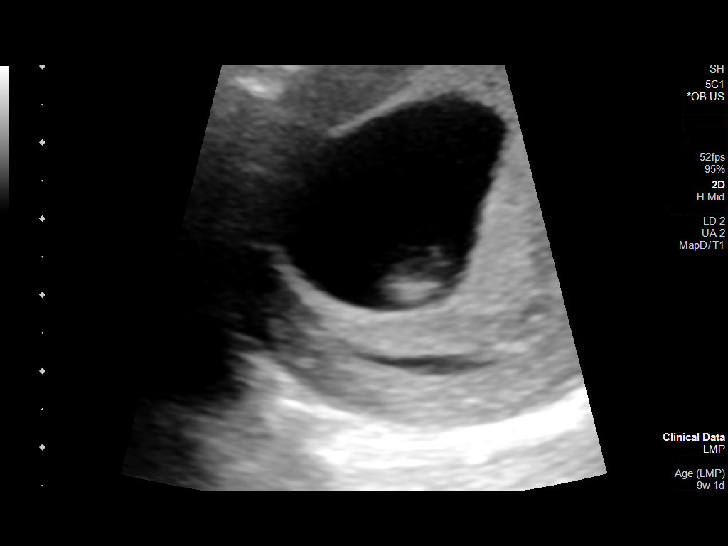
[im 18/45]
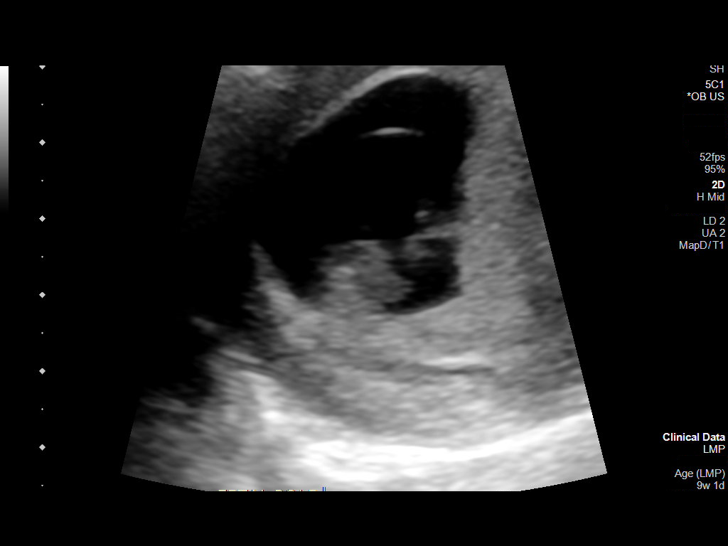
[im 22/45]
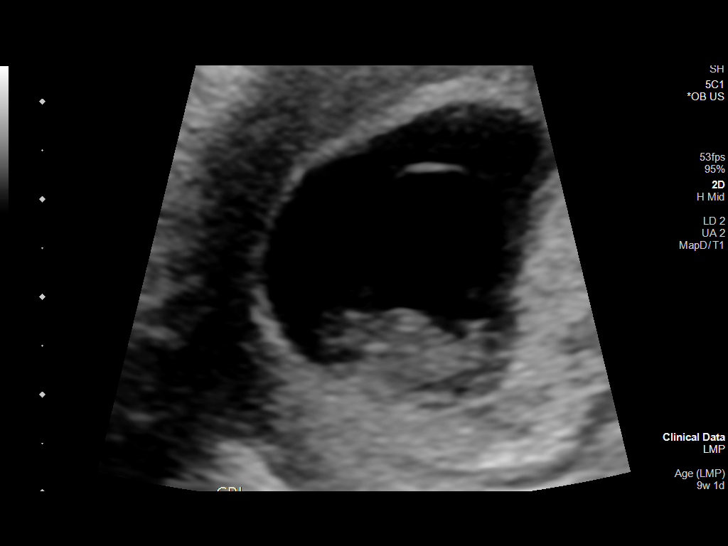
[im 25/45]
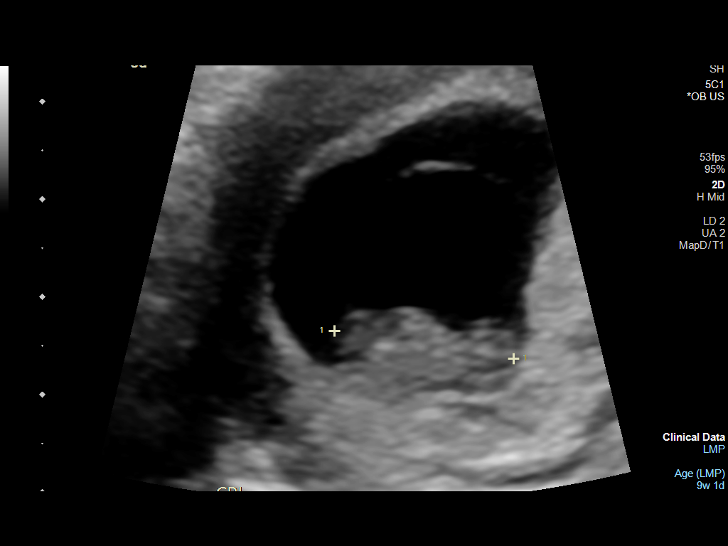
[im 28/45]
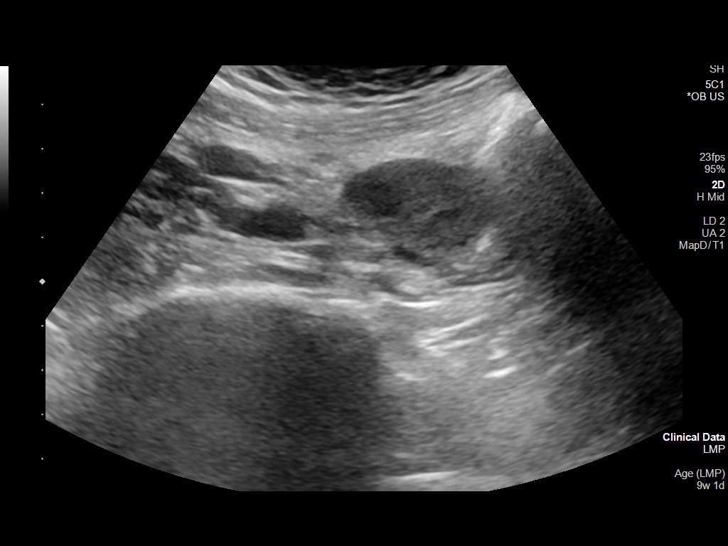
[im 31/45]
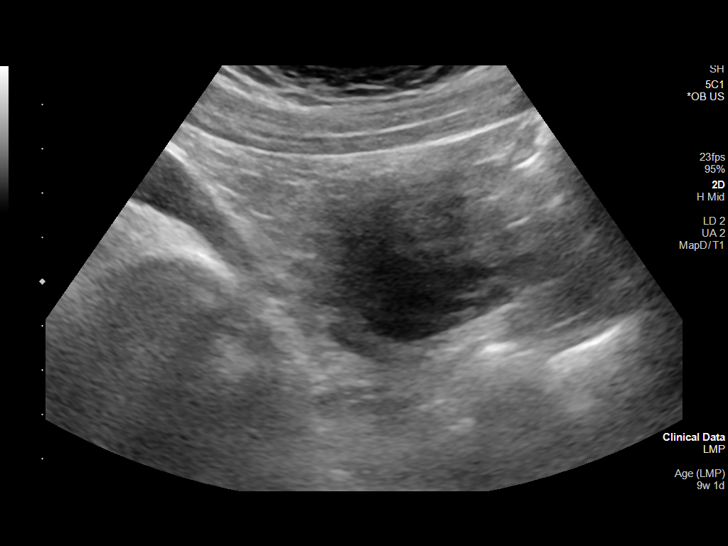
[im 35/45]
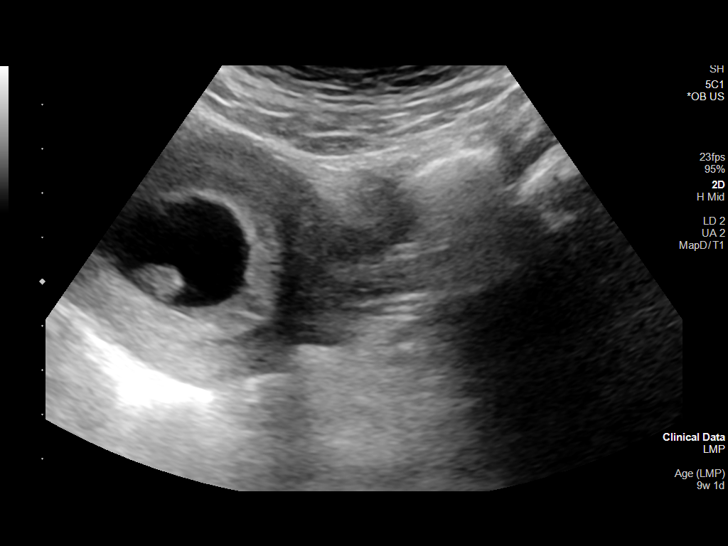
[im 38/45]
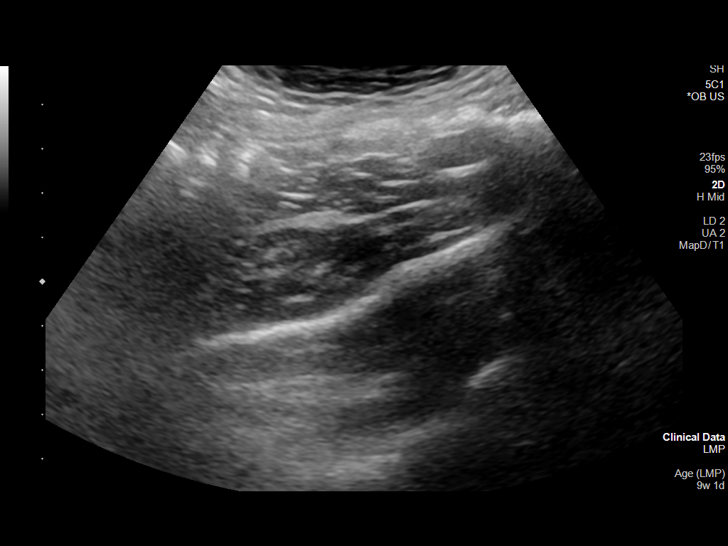
[im 41/45]
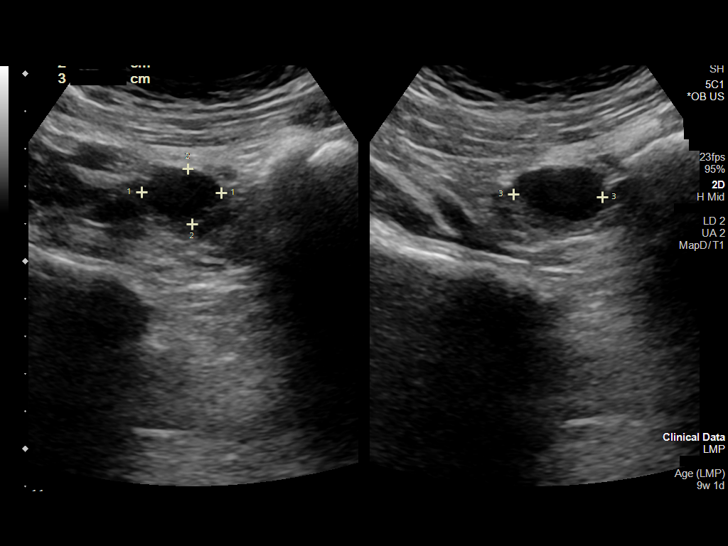
[im 45/45]
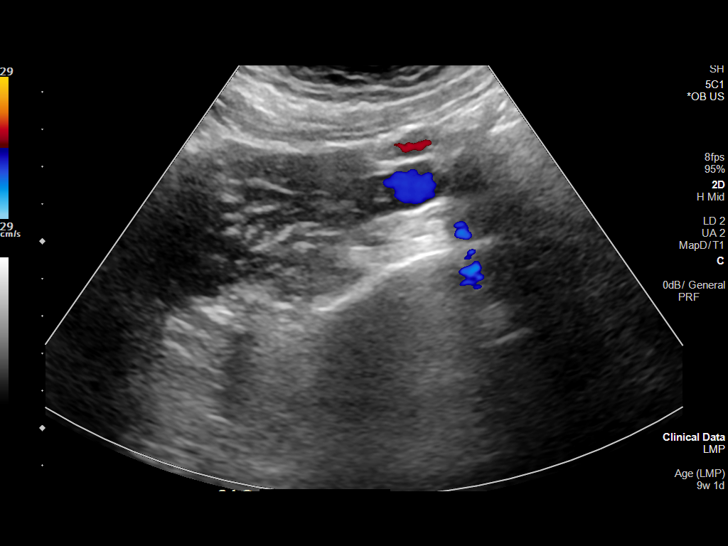

[14 of 28 positions shown; findings below may reference images not displayed]

FINDINGS: Intrauterine gestational sac: Single

Yolk sac:  Visualized.

Embryo:  Visualized.

Cardiac Activity: Visualized.

Heart Rate: 168 bpm

MSD:    mm    w     d

CRL:   18.6 mm   8 w 3 d                  US EDC: 03/29/2022

Subchorionic hemorrhage:  Small subchorionic hemorrhage

Maternal uterus/adnexae: 2.4 cm left ovarian cyst appears simple. No
adnexal masses or free fluid.
IMPRESSION: Eight week 3 day intrauterine pregnancy. Fetal heart rate 168 beats
per minute. Small subchorionic hemorrhage.

## 2023-06-19 IMAGING — US US OB COMP LESS 14 WK
1 series · 14 of 28 positions shown · non-contrast
Comparison: 08/20/2021

CLINICAL DATA: Vaginal bleeding

EXAM:
OBSTETRIC <14 WK ULTRASOUND
TECHNIQUE: Transabdominal ultrasound was performed for evaluation of the
gestation as well as the maternal uterus and adnexal regions.

[Series 1: us ob comp less 14 wk · 40 acquisitions, 14 frames shown]
[im 2/40]
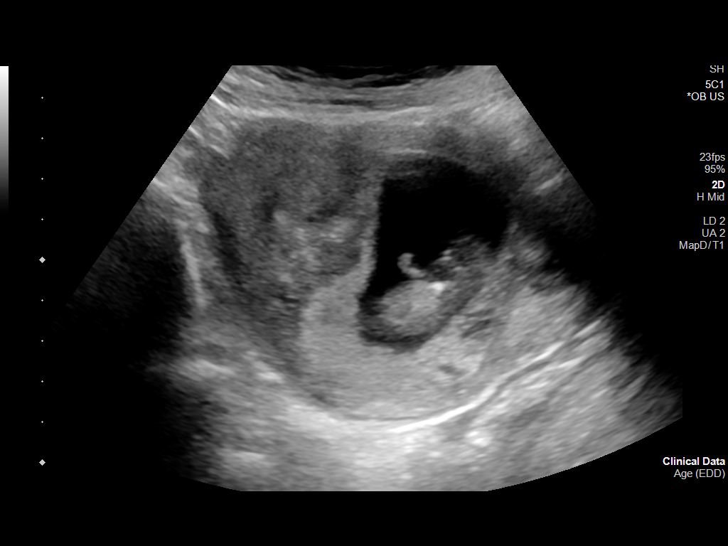
[im 5/40]
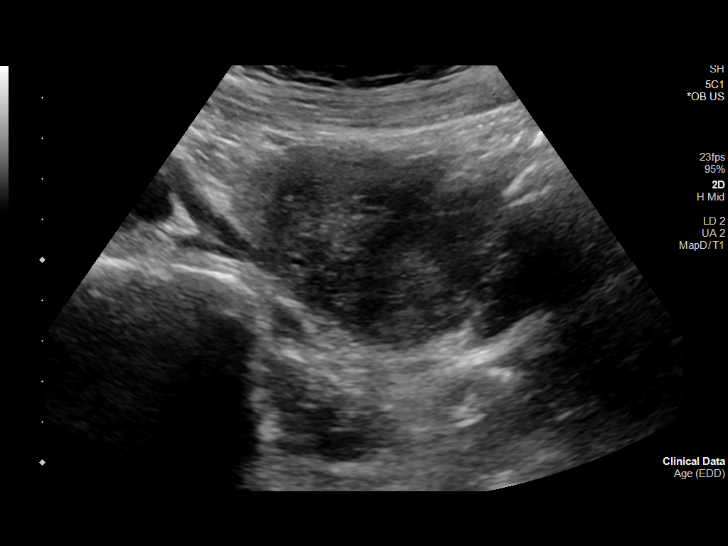
[im 8/40]
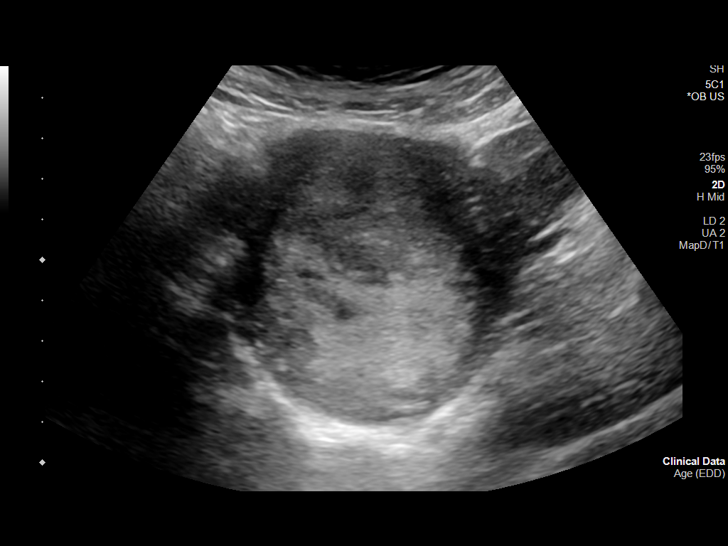
[im 11/40]
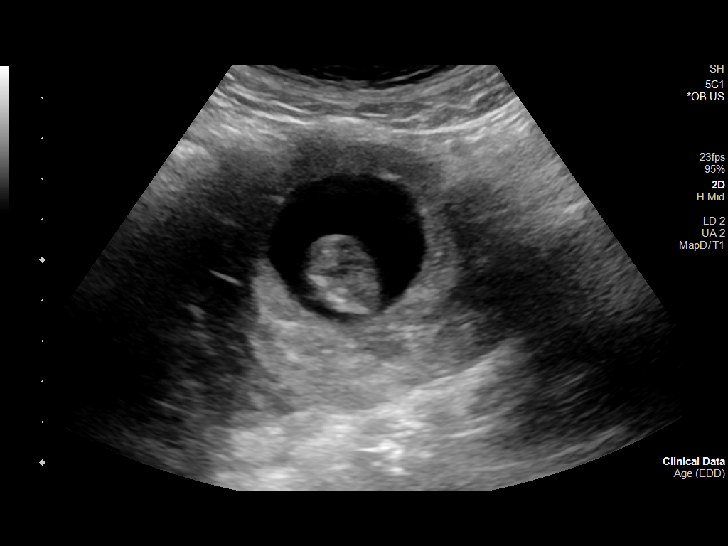
[im 14/40]
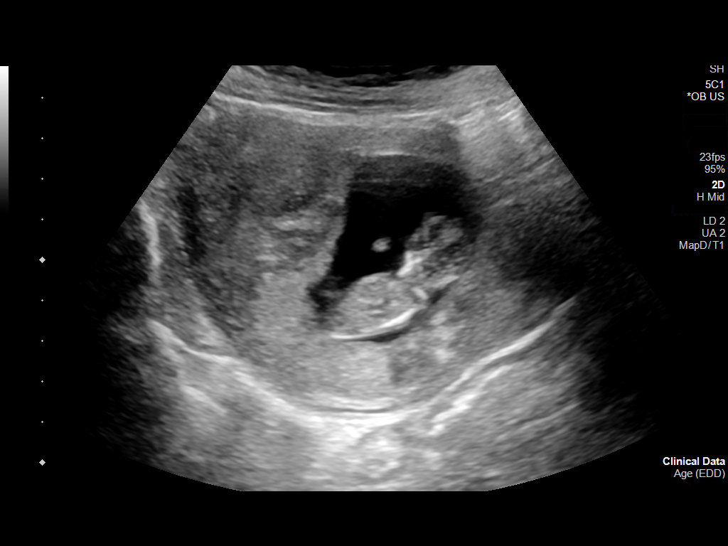
[im 16/40]
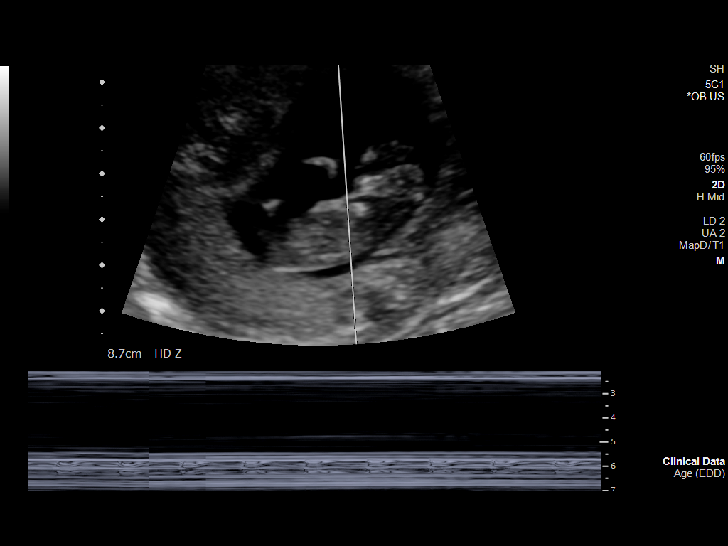
[im 19/40]
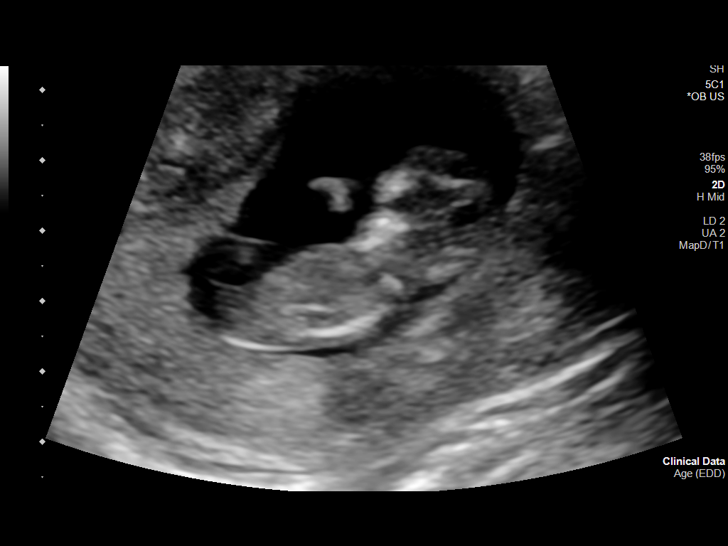
[im 22/40]
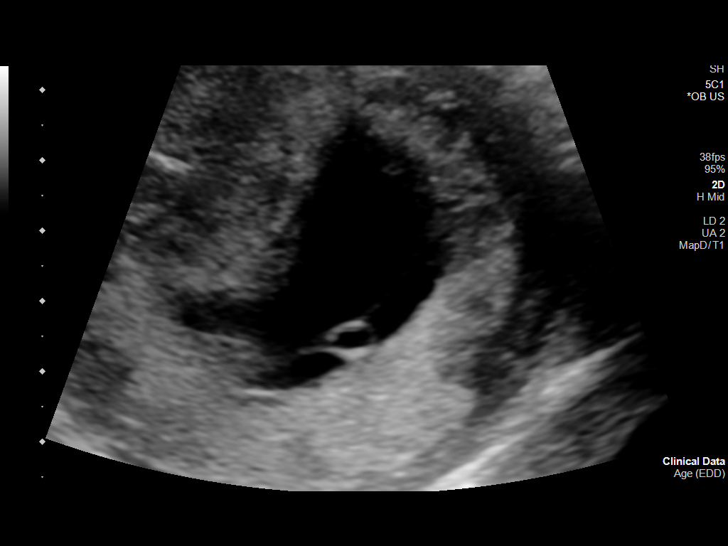
[im 25/40]
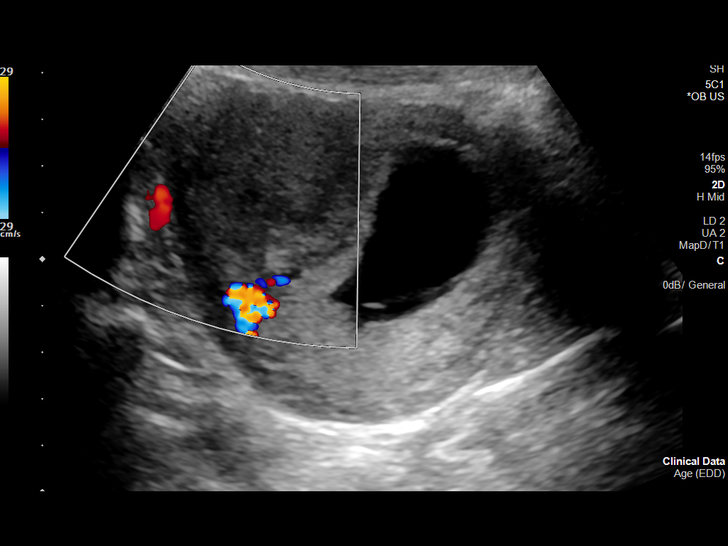
[im 28/40]
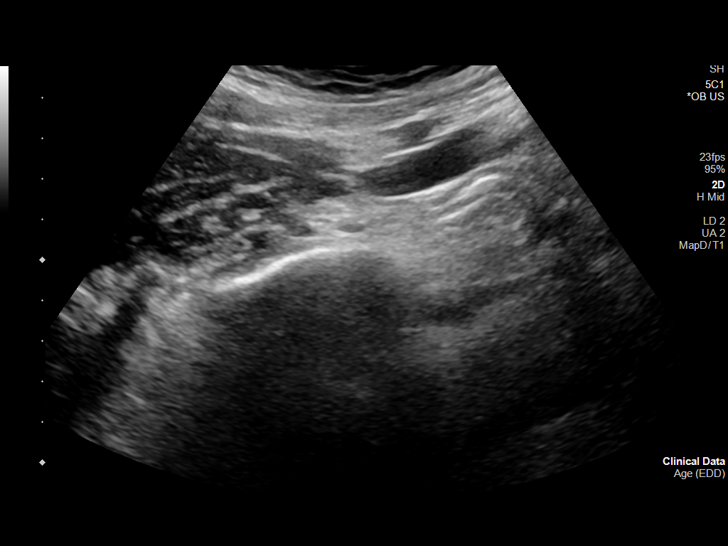
[im 31/40]
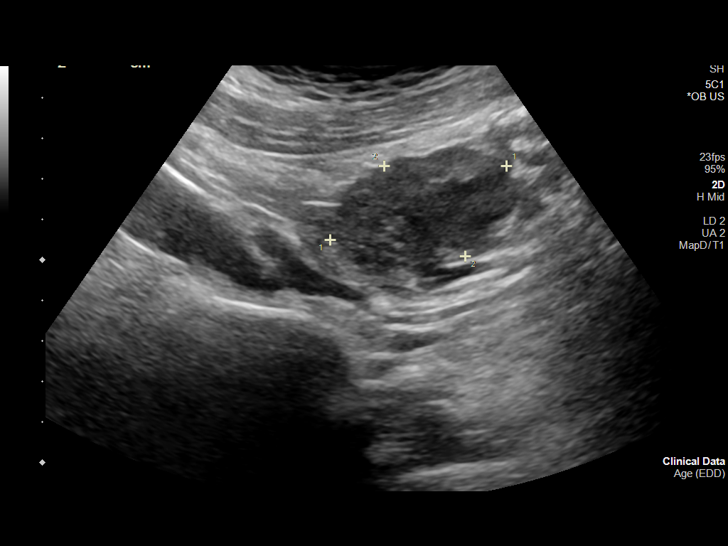
[im 34/40]
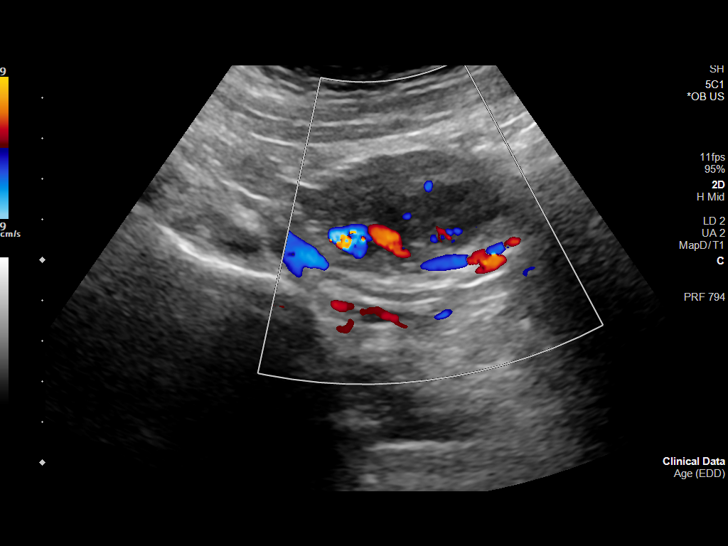
[im 37/40]
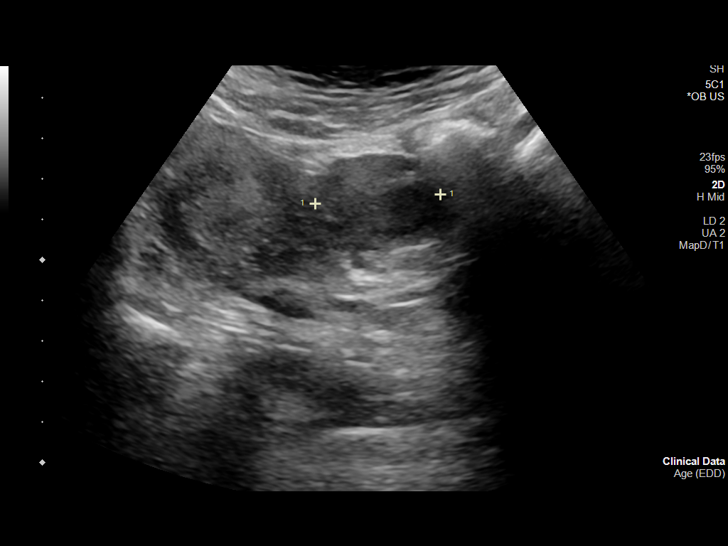
[im 40/40]
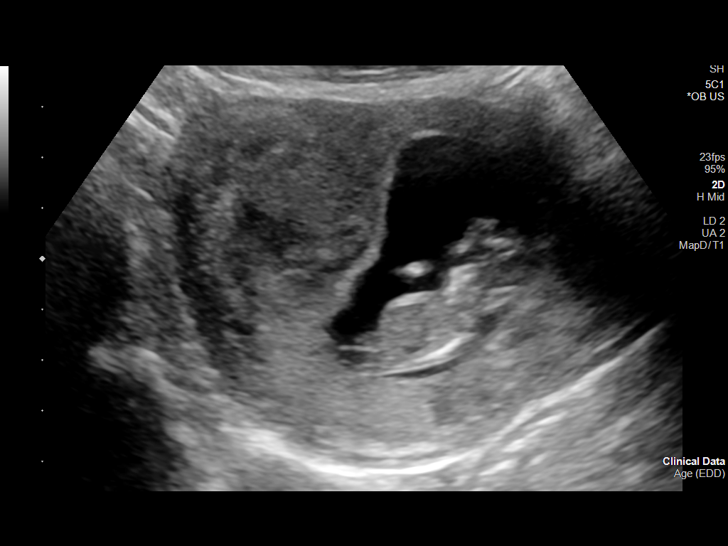

[14 of 28 positions shown; findings below may reference images not displayed]

FINDINGS: Intrauterine gestational sac: Present

Yolk sac:  Present

Embryo:  Present

Cardiac Activity: Present

Heart Rate: 163 bpm

CRL:   42.7 mm   11 w 1 d                  US EDC: 03/23/2022

Subchorionic hemorrhage: Previously seen subchorionic hemorrhage is
not visualized on today's exam.

Maternal uterus/adnexae: Left ovary is within normal limits. Right
ovary is not well visualized. Some heterogeneity is noted in the
fundal region of the uterus of uncertain significance. This was not
well appreciated on the prior exam.
IMPRESSION: Single live intrauterine gestation at 11 weeks 1 day.

Previously seen subchorionic hemorrhage is not well appreciated on
today's exam.

Mild heterogeneity in the fundus of the uterus is seen of uncertain
significance. This was not appreciated on the recent exam.

## 2023-07-05 IMAGING — US US OB COMP LESS 14 WK
1 series · 15 of 27 positions shown · non-contrast
Comparison: 09/02/2021

CLINICAL DATA: Vaginal bleeding

EXAM:
OBSTETRIC <14 WK ULTRASOUND
TECHNIQUE: Transabdominal ultrasound was performed for evaluation of the
gestation as well as the maternal uterus and adnexal regions.

[Series 1: us ob comp less 14 wk · 27 acquisitions, 15 frames shown]
[im 1/27]
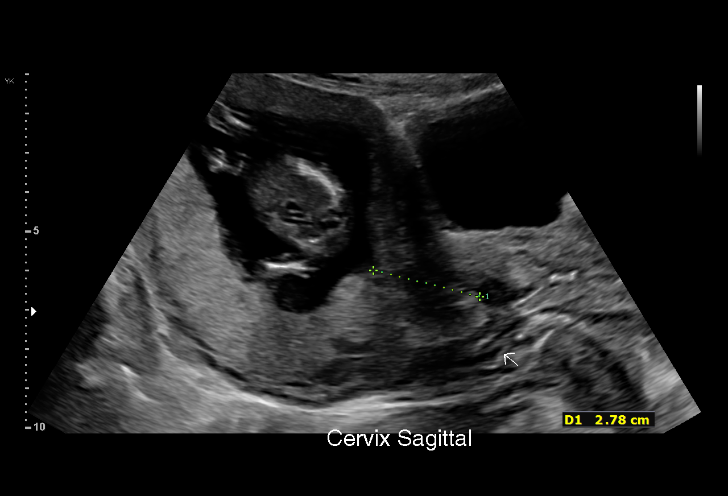
[im 3/27]
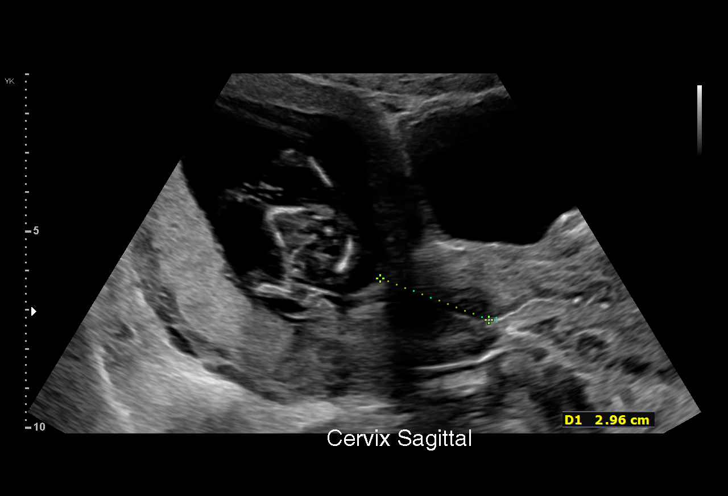
[im 5/27]
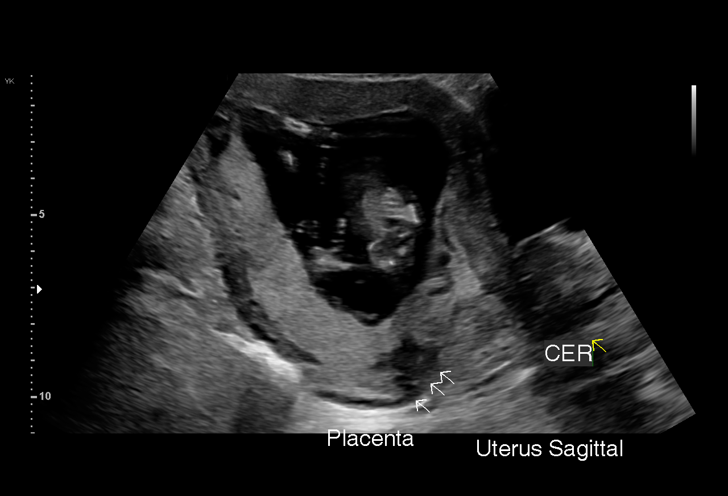
[im 7/27]
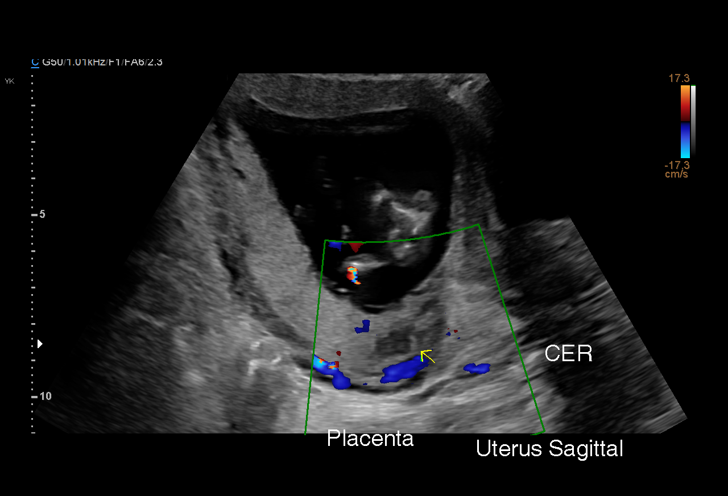
[im 9/27]
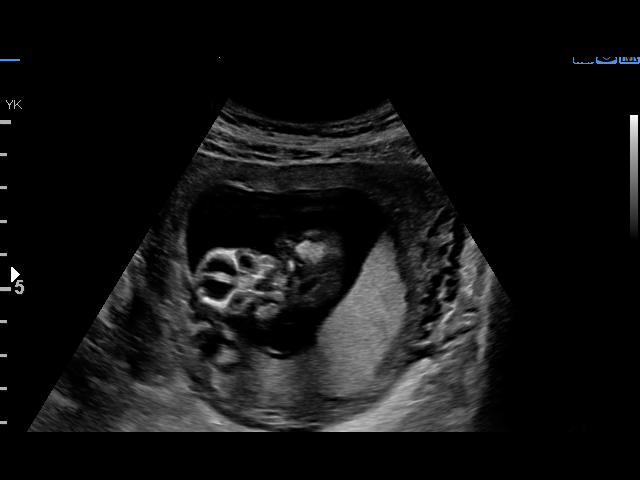
[im 10/27]
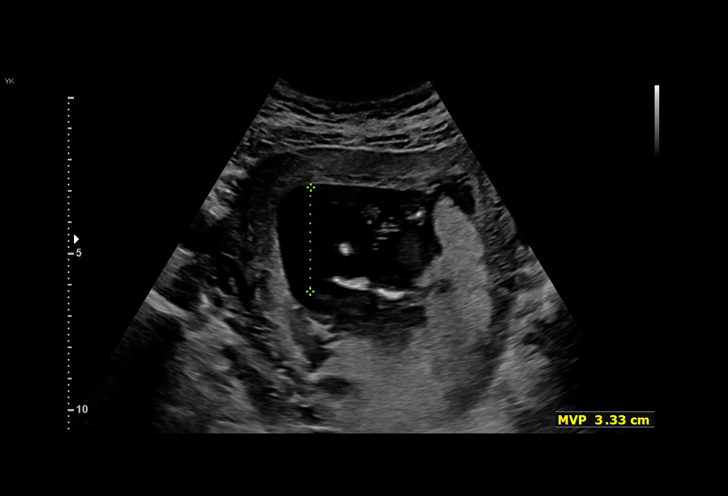
[im 12/27]
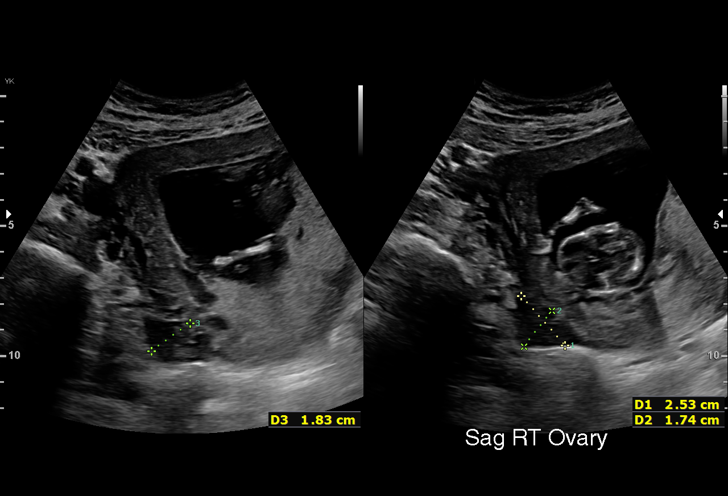
[im 14/27]
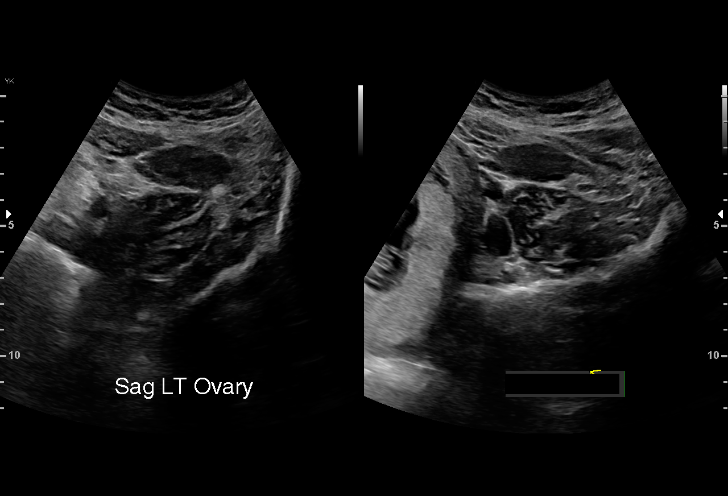
[im 16/27]
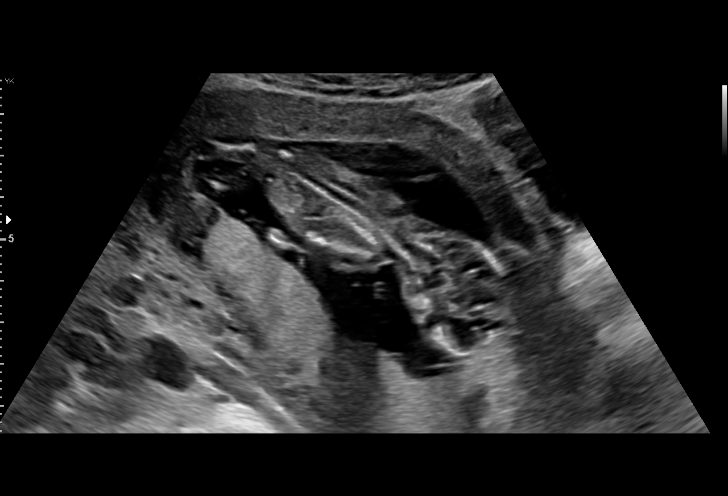
[im 18/27]
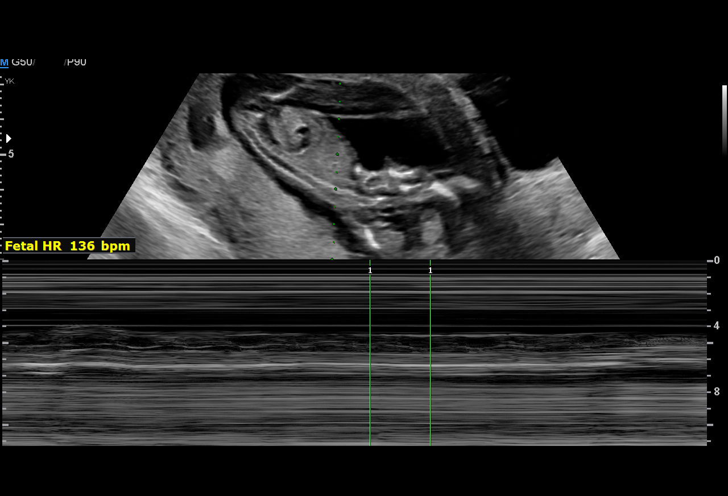
[im 19/27]
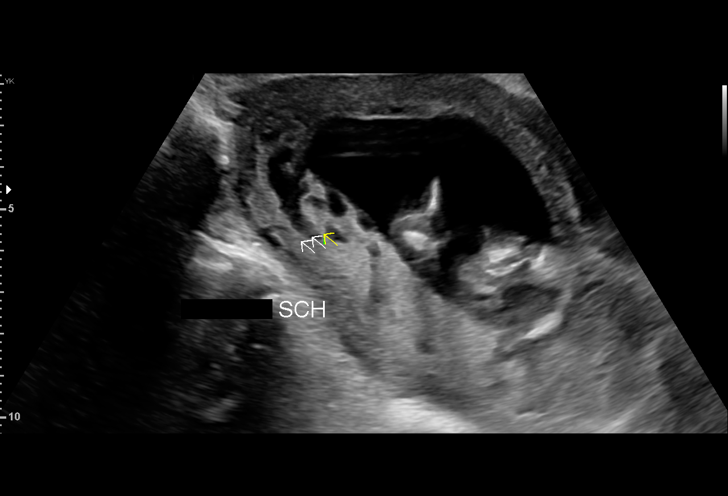
[im 21/27]
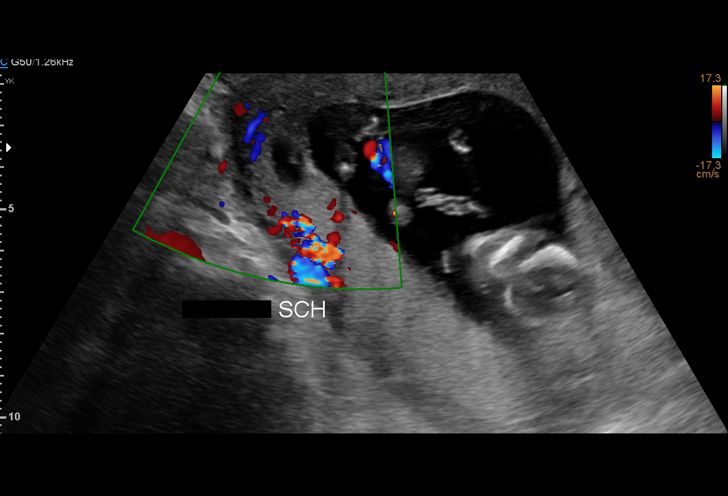
[im 23/27]
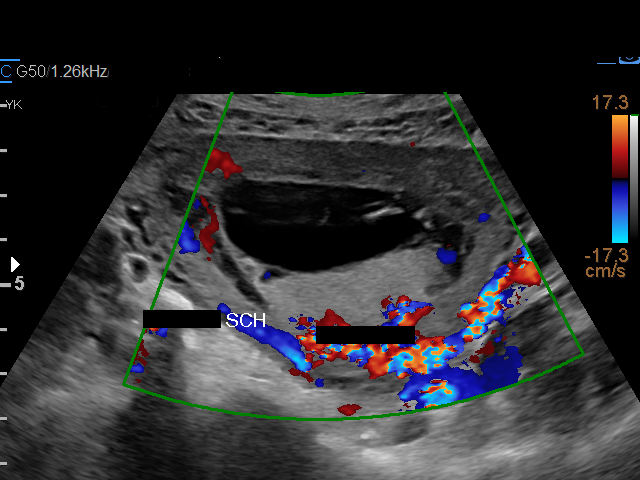
[im 25/27]
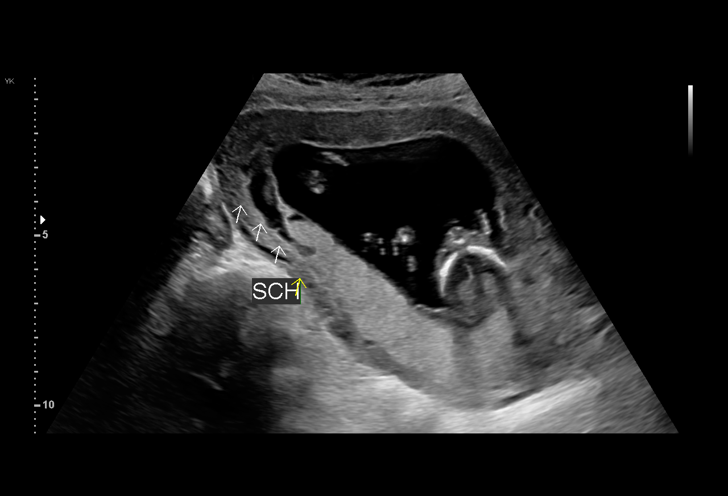
[im 27/27]
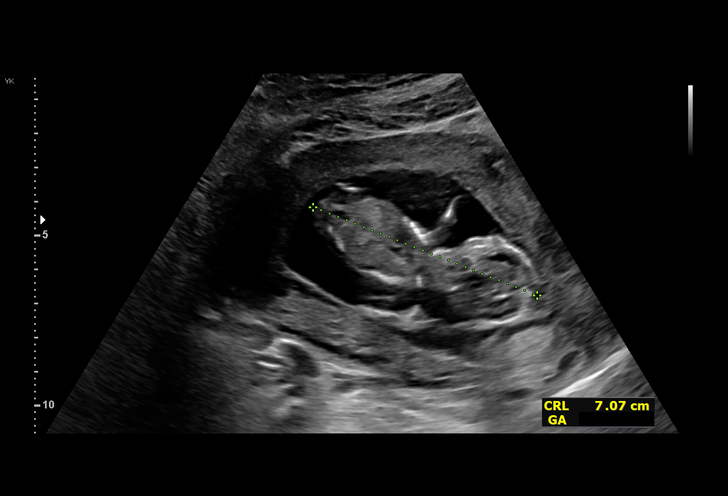

[15 of 27 positions shown; findings below may reference images not displayed]

FINDINGS: Intrauterine gestational sac: Single

Yolk sac:  Not Visualized.

Embryo:  Visualized.

Cardiac Activity: Visualized.

Heart Rate: 136 bpm

CRL:   72.1 mm   13 w 3 d                  US EDC: 03/23/2022

Subchorionic hemorrhage: Small areas of subplacental hemorrhage are
seen along the inferior margin of placenta measuring 1.4 cm, and
along the superior aspect of the placenta measuring 2.2 cm.

Maternal uterus/adnexae: Cervix is closed measuring 3.6 cm in
length. No adnexal masses.
IMPRESSION: 1. Single live intrauterine pregnancy as above, estimated age 13
weeks and 3 days.
2. Small areas of subchorionic hemorrhage at the superior and
inferior margins of the formed placenta as above.

## 2023-07-14 IMAGING — US US OB LIMITED
1 series · 13 of 28 positions shown · non-contrast
Comparison: None.

CLINICAL DATA: Fall down stairs.  Second trimester pregnancy.

EXAM:
LIMITED OBSTETRIC ULTRASOUND

[Series 1: us ob limited · 28 acquisitions, 13 frames shown]
[im 2/28]
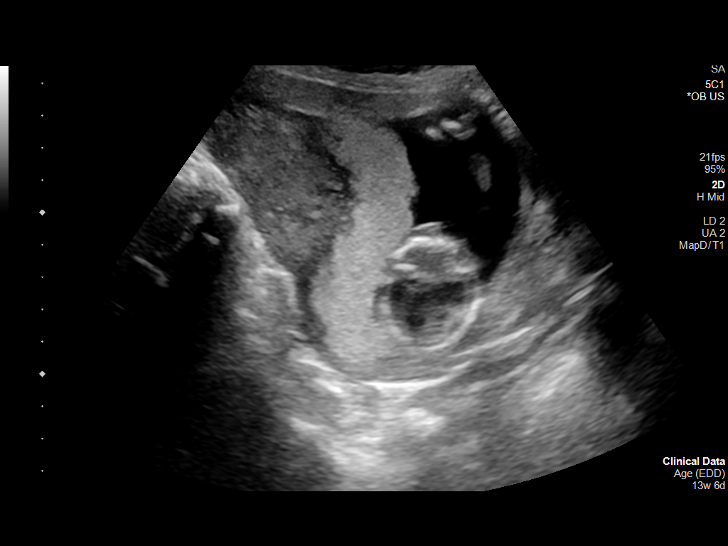
[im 4/28]
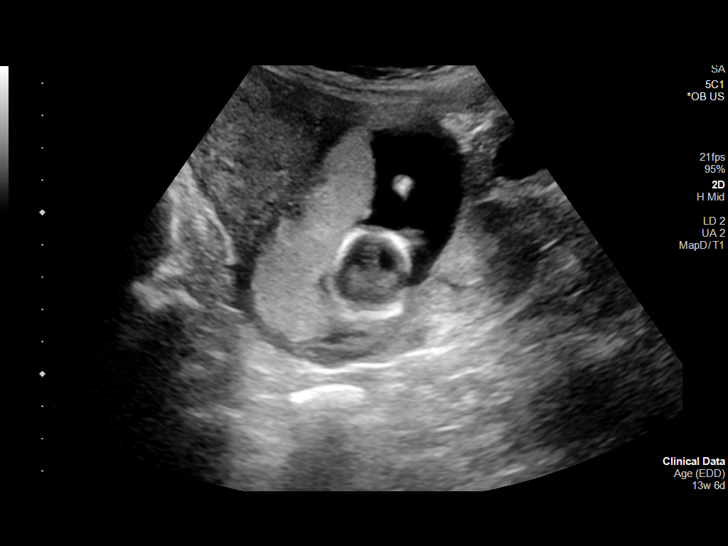
[im 6/28]
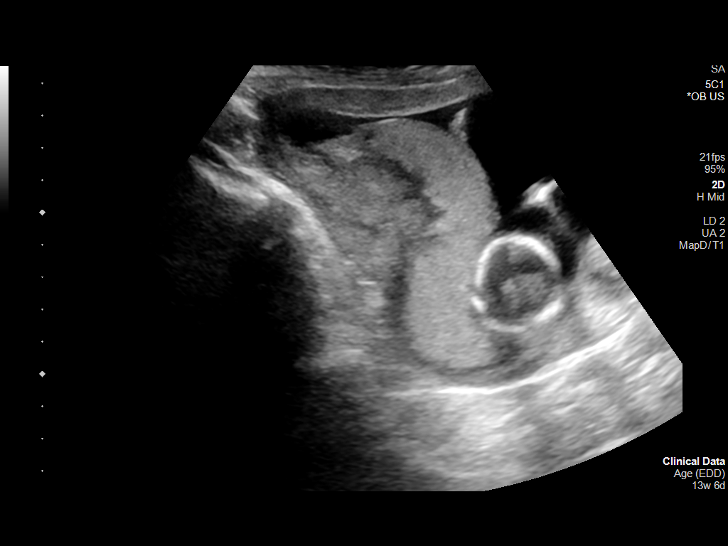
[im 8/28]
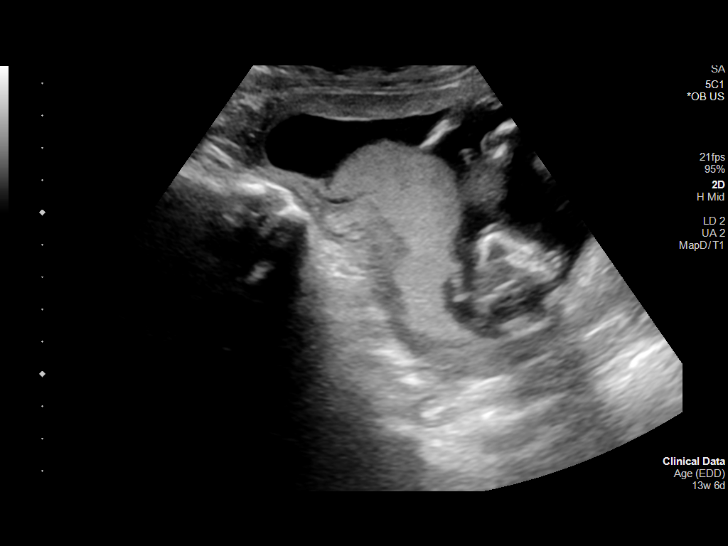
[im 10/28]
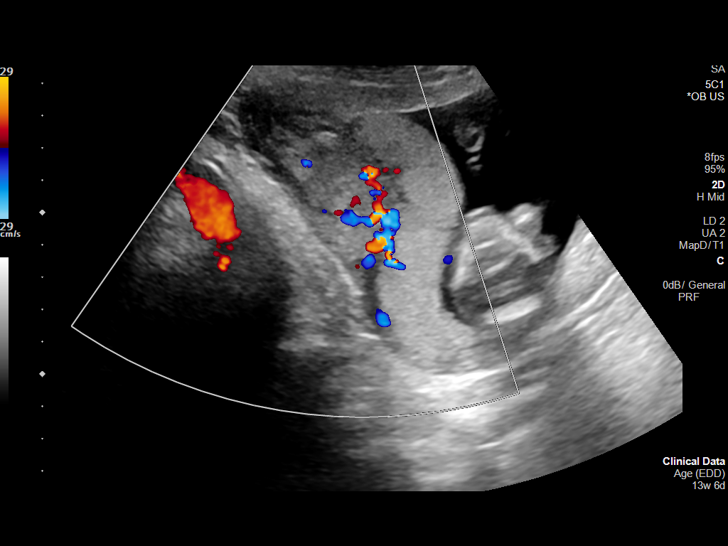
[im 12/28]
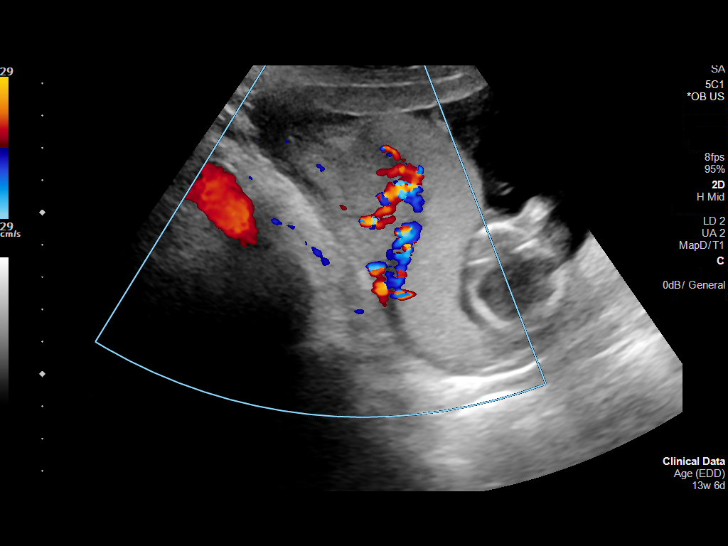
[im 15/28]
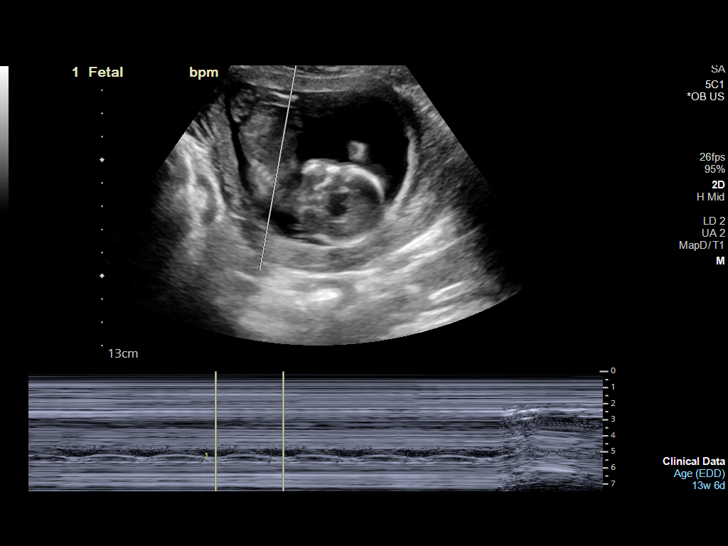
[im 17/28]
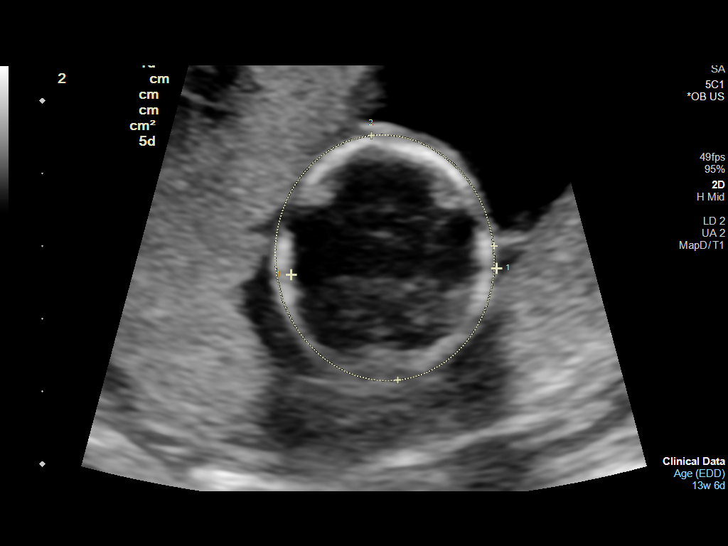
[im 19/28]
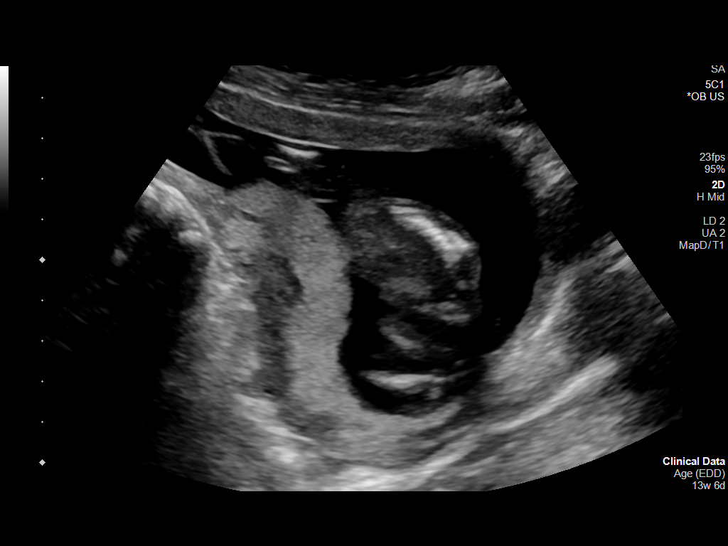
[im 21/28]
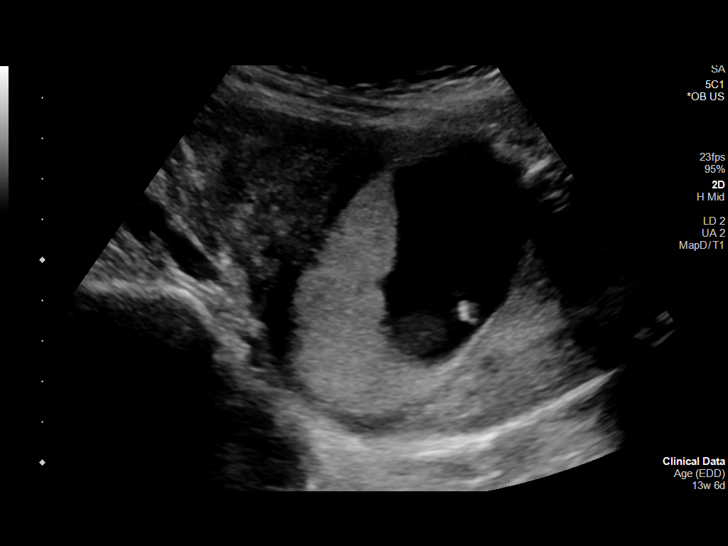
[im 23/28]
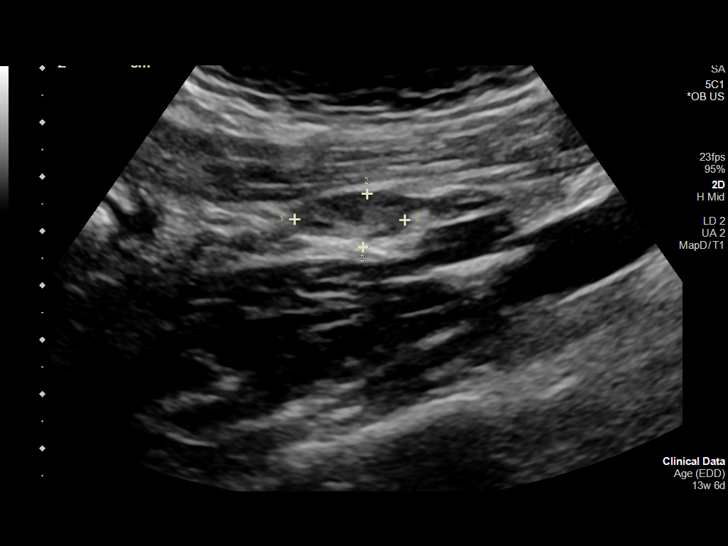
[im 25/28]
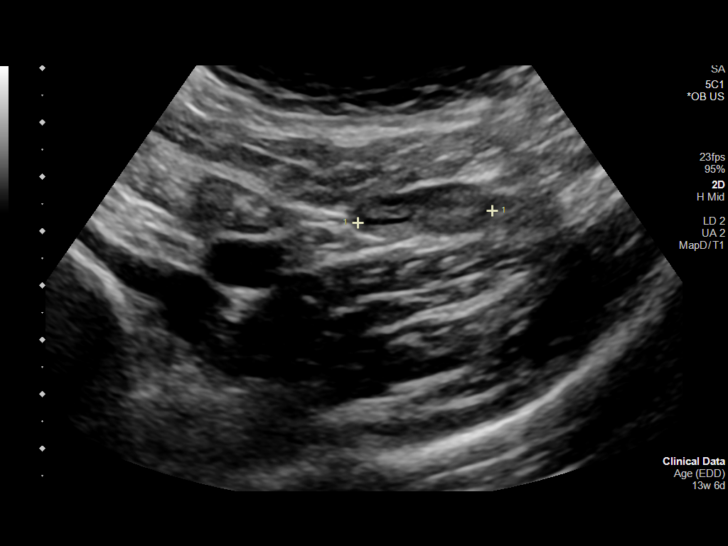
[im 27/28]
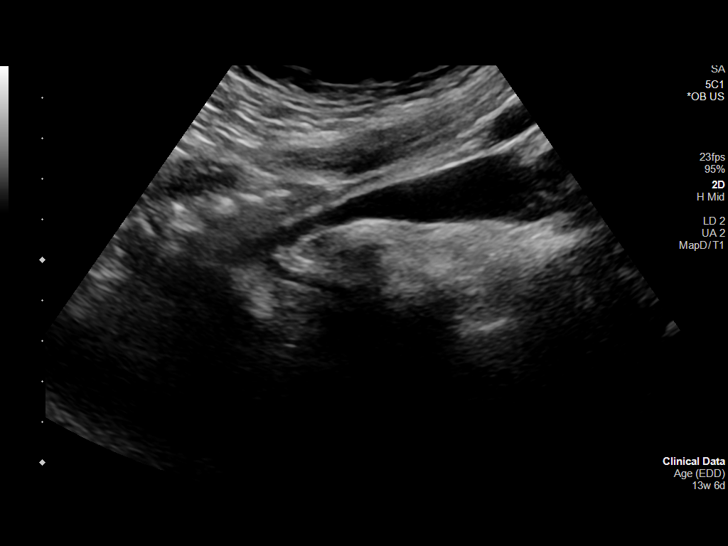

[13 of 28 positions shown; findings below may reference images not displayed]

FINDINGS: Number of Fetuses: 1

Heart Rate:  152 bpm

Movement: Present

Presentation: Cephalic currently

Placental Location: Right lateral

Previa: Absent

Amniotic Fluid (Subjective):  Within normal limits.

AFI: N/A cm

BPD: 0.8 cm 15 w  1 d

MATERNAL FINDINGS:

Cervix:  Appears closed.

Uterus/Adnexae: No abnormality visualized.

Other: 5.5 by 3.1 cm hypoechoic region below the placenta on image
25 of series [DATE] appears substantially vascular. It would be
unusual for a placental abruption to have such vascularity, making
this somewhat more likely to be a muscular contraction or a
chorioangioma, although abruption is difficult to conclusively
exclude on today's limited exam.
IMPRESSION: 1. Single living intrauterine pregnancy measuring at 15 weeks 1 day
gestation
2. Substantially vascular retroplacental hypoechoic structure for
example on image 13 of series [DATE]. The appearance is nonspecific
although I would tend to favor this as being a myometrial
contraction or chorioangioma given the internal vascularity on
Doppler assessment. Placental abruption seems likely a less likely
differential diagnostic consideration but is not excluded. Correlate
with clinical indicators; low threshold for reimaging suggested. I
reviewed this appearance with Dr. Venny Shawver, who concurred.

This exam is performed on an emergent basis and does not
comprehensively evaluate fetal size, dating, or anatomy; follow-up
complete OB US should be considered if further fetal assessment is
warranted.

## 2023-07-25 ENCOUNTER — Other Ambulatory Visit: Payer: Self-pay

## 2023-07-25 ENCOUNTER — Encounter (HOSPITAL_BASED_OUTPATIENT_CLINIC_OR_DEPARTMENT_OTHER): Payer: Self-pay | Admitting: *Deleted

## 2023-07-25 ENCOUNTER — Emergency Department (HOSPITAL_BASED_OUTPATIENT_CLINIC_OR_DEPARTMENT_OTHER)
Admission: EM | Admit: 2023-07-25 | Discharge: 2023-07-25 | Disposition: A | Discharge status: Home / Self Care | Payer: Medicaid Other | Attending: Emergency Medicine | Admitting: Emergency Medicine

## 2023-07-25 DIAGNOSIS — J45909 Unspecified asthma, uncomplicated: Secondary | ICD-10-CM | POA: Diagnosis not present

## 2023-07-25 DIAGNOSIS — Y92410 Unspecified street and highway as the place of occurrence of the external cause: Secondary | ICD-10-CM | POA: Diagnosis not present

## 2023-07-25 DIAGNOSIS — M545 Low back pain, unspecified: Secondary | ICD-10-CM

## 2023-07-25 DIAGNOSIS — M549 Dorsalgia, unspecified: Secondary | ICD-10-CM | POA: Diagnosis present

## 2023-07-25 MED ORDER — METHOCARBAMOL 500 MG PO TABS: 500.0000 mg | ORAL_TABLET | Freq: Two times a day (BID) | ORAL | 0 refills | Status: AC | PRN

## 2023-07-25 MED ORDER — IBUPROFEN 600 MG PO TABS
600.0000 mg | ORAL_TABLET | Freq: Four times a day (QID) | ORAL | 0 refills | Status: AC | PRN
Start: 2023-07-25 — End: ?
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
## 2023-07-25 NOTE — Discharge Instructions (Signed)
As discussed, suspect your pain is most likely from muscular injury.  Will treat with anti-inflammatories as well as muscle relaxer to use as needed.  The muscle laxer can cause drowsiness so please do not drive or perform any high risk activity and to utilize effects on you.  See information attached regarding rehab exercises.  Recommend follow-up with your primary care for reassessment.  Please do not hesitate to return to emergency department if the worrisome signs and symptoms we discussed become apparent.
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
## 2023-12-19 ENCOUNTER — Emergency Department (HOSPITAL_BASED_OUTPATIENT_CLINIC_OR_DEPARTMENT_OTHER)
Admission: EM | Admit: 2023-12-19 | Discharge: 2023-12-19 | Disposition: A | Discharge status: Home / Self Care | Attending: Emergency Medicine | Admitting: Emergency Medicine

## 2023-12-19 ENCOUNTER — Other Ambulatory Visit: Payer: Self-pay

## 2023-12-19 ENCOUNTER — Encounter (HOSPITAL_BASED_OUTPATIENT_CLINIC_OR_DEPARTMENT_OTHER): Payer: Self-pay

## 2023-12-19 DIAGNOSIS — G510 Bell's palsy: Secondary | ICD-10-CM | POA: Insufficient documentation

## 2023-12-19 DIAGNOSIS — R200 Anesthesia of skin: Secondary | ICD-10-CM | POA: Diagnosis present

## 2023-12-19 MED ORDER — HYPROMELLOSE (GONIOSCOPIC) 2.5 % OP SOLN
1.0000 [drp] | OPHTHALMIC | 12 refills | Status: AC | PRN
Start: 2023-12-19 — End: ?

## 2023-12-19 MED ORDER — PREDNISONE 50 MG PO TABS
60.0000 mg | ORAL_TABLET | Freq: Once | ORAL | Status: AC
  Administered 2023-12-19: 60 mg via ORAL
  Filled 2023-12-19: qty 1

## 2023-12-19 MED ORDER — PREDNISONE 20 MG PO TABS: 60.0000 mg | ORAL_TABLET | Freq: Every day | ORAL | 0 refills | Status: AC

## 2023-12-19 NOTE — ED Notes (Signed)
 Pt alert and oriented X 4 at the time of discharge. RR even and unlabored. No acute distress noted. Pt verbalized understanding of discharge instructions as discussed. Pt ambulatory to lobby at time of discharge.

## 2023-12-19 NOTE — ED Provider Notes (Signed)
 [place] [facility] Provider Note   CSN: [id number] Arrival date & time: 12/19/23  1239     Patient presents with: multiple complaints   Brenda Ramirez is a 23 y.o. female.  Who presents to the ED for neurologic symptoms.  Patient reports 3 days of symptoms including loss of taste loss of sensation on the left side of her face and decreased movement on the left side of her face.  She has had 1 episode of diarrhea earlier today.  No fevers chills, rashes, recent insect bites/tick bites or history of similar symptoms.  No focal weakness in the extremities or severe headaches.   HPI     Prior to Admission medications   Medication Sig Start Date End Date Taking? Authorizing Provider  hydroxypropyl methylcellulose / hypromellose (ISOPTO TEARS / GONIOVISC) 2.5 % ophthalmic solution Place 1 drop into the left eye every hour as needed for dry eyes. 12/19/23  Yes Pamella Ozell LABOR, DO  predniSONE  (DELTASONE ) 20 MG tablet Take 3 tablets (60 mg total) by mouth daily with breakfast for 7 days. 12/19/23 12/26/23 Yes Pamella Ozell LABOR, DO  ibuprofen  (ADVIL ) 600 MG tablet Take 1 tablet (600 mg total) by mouth every 6 (six) hours as needed. 07/25/23   Silver Wonda LABOR, PA  methocarbamol  (ROBAXIN ) 500 MG tablet Take 1 tablet (500 mg total) by mouth 2 (two) times daily as needed for muscle spasms. 07/25/23   Silver Wonda LABOR, PA  Prenatal Vit-Fe Fumarate-FA (PRENATAL VITAMIN) 27-0.8 MG TABS Take 1 tablet by mouth daily. 08/30/21   Fredirick Glenys RAMAN, MD    Allergies: Pineapple    Review of Systems  Updated Vital Signs BP 112/67 (BP Location: Right Arm)   Pulse 78   Temp 97.9 F (36.6 C)   Resp 20   LMP 12/14/2023   SpO2 100%   Physical Exam Vitals and nursing note reviewed.  HENT:     Head: Normocephalic and atraumatic.  Eyes:     Pupils: Pupils are equal, round, and reactive to light.     Comments: Inability to completely close left eye   Cardiovascular:     Rate and Rhythm: Normal rate and regular rhythm.  Pulmonary:     Effort: Pulmonary effort is normal.     Breath sounds: Normal breath sounds.  Abdominal:     Palpations: Abdomen is soft.     Tenderness: There is no abdominal tenderness.  Skin:    General: Skin is warm and dry.     Findings: No erythema or rash.  Neurological:     Mental Status: She is alert.     Comments: Inability to completely raise left eyebrow Drooping left side of the mouth with smiling Decreased sensation to light touch on left side of face  Psychiatric:        Mood and Affect: Mood normal.     (all labs ordered are listed, but only abnormal results are displayed) Labs Reviewed  LYME DISEASE SEROLOGY W/REFLEX    EKG: None  Radiology: No results found.   Procedures   Medications Ordered in the ED  predniSONE  (DELTASONE ) tablet 60 mg (60 mg Oral Given 12/19/23 1330)                                    Medical Decision Making 23 year old female with history as above presenting given concern for symptoms including facial numbness  loss of taste inability to close left eye.  Presentation most consistent with Bell's palsy.  Low suspicion for Lyme disease no known tick bites but will send Lyme test here.  No concomitant ear infection, rashes or lesions.  Will give first dose of prednisone  here and prescribe 1 week of 60 mg daily.  Counseled her on importance of eye protection.  Will prescribe artificial tears as well instructed for ophthalmology follow-up        Final diagnoses:  Bell's palsy    ED Discharge Orders          Ordered    predniSONE  (DELTASONE ) 20 MG tablet  Daily with breakfast        12/19/23 1342    hydroxypropyl methylcellulose / hypromellose (ISOPTO TEARS / GONIOVISC) 2.5 % ophthalmic solution  Every 1 hour PRN        12/19/23 1342               Pamella Ozell LABOR, DO 12/19/23 1344

## 2023-12-19 NOTE — Discharge Instructions (Signed)
 You were seen in the emerged part for Bell's palsy We gave your first dose of prednisone  here We have called in 1 week worth of prednisone  to help with your symptoms of Bell's palsy We have also called in a prescription for eyedrops Place 1 drop in your eye every hour while awake to keep your eye dry You should also try taping your eye shut at night to protect it while you are asleep Follow-up with Dr. Austin who is an eye doctor within the next week to make sure your eye is still okay Return to the Emergency Department for worsening of symptoms Please follow-up on the results of your Lyme test

## 2023-12-19 NOTE — ED Triage Notes (Signed)
 Reports loss of sensation & taste on tongue X 2 days. Heaviness in eyes since yesterday. Also endorses heartburn

## 2023-12-20 LAB — LYME DISEASE SEROLOGY W/REFLEX: Lyme Total Antibody EIA: NEGATIVE
